# Patient Record
Sex: Female | Born: 1940 | Race: White | Hispanic: No | Smoking: Never smoker
Health system: Southern US, Community
[De-identification: ages and names within clinical notes are randomized; demographics above are authoritative.]

## PROBLEM LIST (undated history)

## (undated) ENCOUNTER — Ambulatory Visit: Disposition: A | Discharge status: Other Acute Inpatient Hospital | Payer: Medicare Other

## (undated) DIAGNOSIS — N200 Calculus of kidney: Secondary | ICD-10-CM

## (undated) DIAGNOSIS — I10 Essential (primary) hypertension: Secondary | ICD-10-CM

## (undated) DIAGNOSIS — E785 Hyperlipidemia, unspecified: Secondary | ICD-10-CM

## (undated) HISTORY — PX: CATARACT EXTRACTION: SUR2

## (undated) HISTORY — PX: COLON SURGERY: SHX602

## (undated) HISTORY — PX: ABDOMINAL HYSTERECTOMY: SHX81

## (undated) HISTORY — PX: OTHER SURGICAL HISTORY: SHX169

---

## 2005-11-12 ENCOUNTER — Ambulatory Visit: Payer: Self-pay | Admitting: Unknown Physician Specialty

## 2005-11-24 ENCOUNTER — Ambulatory Visit: Payer: Self-pay | Admitting: Unknown Physician Specialty

## 2006-02-10 ENCOUNTER — Ambulatory Visit: Payer: Self-pay | Admitting: Gastroenterology

## 2006-11-29 ENCOUNTER — Ambulatory Visit: Payer: Self-pay | Admitting: Unknown Physician Specialty

## 2007-12-04 ENCOUNTER — Ambulatory Visit: Payer: Self-pay | Admitting: Unknown Physician Specialty

## 2008-12-05 ENCOUNTER — Ambulatory Visit: Payer: Self-pay | Admitting: Unknown Physician Specialty

## 2009-12-08 ENCOUNTER — Ambulatory Visit: Payer: Self-pay | Admitting: Unknown Physician Specialty

## 2010-12-15 ENCOUNTER — Ambulatory Visit: Payer: Self-pay | Admitting: Unknown Physician Specialty

## 2011-12-16 ENCOUNTER — Ambulatory Visit: Payer: Self-pay | Admitting: Unknown Physician Specialty

## 2012-09-19 ENCOUNTER — Observation Stay: Payer: Self-pay | Admitting: Internal Medicine

## 2012-09-19 LAB — COMPREHENSIVE METABOLIC PANEL
Albumin: 4.3 g/dL (ref 3.4–5.0)
BUN: 19 mg/dL — ABNORMAL HIGH (ref 7–18)
Bilirubin,Total: 0.2 mg/dL (ref 0.2–1.0)
Creatinine: 0.79 mg/dL (ref 0.60–1.30)
EGFR (Non-African Amer.): >60
Osmolality: 281 (ref 275–301)
SGOT(AST): 18 U/L (ref 15–37)
Sodium: 139 mmol/L (ref 136–145)

## 2012-09-19 LAB — CBC
HCT: 39.3 % (ref 35.0–47.0)
HGB: 13.7 g/dL (ref 12.0–16.0)
MCHC: 34.9 g/dL (ref 32.0–36.0)
WBC: 6.2 10*3/uL (ref 3.6–11.0)

## 2012-09-19 LAB — URINALYSIS, COMPLETE
Bilirubin,UR: NEGATIVE
Ketone: NEGATIVE
Nitrite: NEGATIVE
Ph: 6 (ref 4.5–8.0)
Protein: NEGATIVE
RBC,UR: 33 /HPF (ref 0–5)
Squamous Epithelial: NONE SEEN
WBC UR: 128 /HPF (ref 0–5)

## 2012-09-19 LAB — LIPID PANEL
Cholesterol: 199 mg/dL (ref 0–200)
HDL Cholesterol: 37 mg/dL — ABNORMAL LOW (ref 40–60)
Ldl Cholesterol, Calc: 108 mg/dL — ABNORMAL HIGH (ref 0–100)
VLDL Cholesterol, Calc: 54 mg/dL — ABNORMAL HIGH (ref 5–40)

## 2012-09-20 LAB — BASIC METABOLIC PANEL
BUN: 16 mg/dL (ref 7–18)
Chloride: 106 mmol/L (ref 98–107)
Co2: 29 mmol/L (ref 21–32)
Creatinine: 0.72 mg/dL (ref 0.60–1.30)
EGFR (African American): >60
EGFR (Non-African Amer.): >60
Glucose: 109 mg/dL — ABNORMAL HIGH (ref 65–99)
Osmolality: 281 (ref 275–301)

## 2012-09-20 LAB — MAGNESIUM: Magnesium: 1.5 mg/dL — ABNORMAL LOW

## 2012-09-20 LAB — CBC WITH DIFFERENTIAL/PLATELET
Eosinophil #: 0.2 10*3/uL (ref 0.0–0.7)
HCT: 37.9 % (ref 35.0–47.0)
HGB: 13.2 g/dL (ref 12.0–16.0)
Neutrophil #: 4 10*3/uL (ref 1.4–6.5)
Platelet: 219 10*3/uL (ref 150–440)
RBC: 4.38 10*6/uL (ref 3.80–5.20)
RDW: 14.1 % (ref 11.5–14.5)

## 2012-09-22 LAB — URINE CULTURE

## 2013-02-08 ENCOUNTER — Ambulatory Visit: Payer: Self-pay | Admitting: Unknown Physician Specialty

## 2013-06-11 ENCOUNTER — Ambulatory Visit: Payer: Self-pay | Admitting: Ophthalmology

## 2013-06-11 LAB — POTASSIUM: Potassium: 4.3 mmol/L (ref 3.5–5.1)

## 2013-06-18 ENCOUNTER — Ambulatory Visit: Payer: Self-pay | Admitting: Ophthalmology

## 2013-07-10 ENCOUNTER — Ambulatory Visit: Payer: Self-pay | Admitting: Ophthalmology

## 2014-02-11 ENCOUNTER — Ambulatory Visit: Payer: Self-pay | Admitting: Unknown Physician Specialty

## 2014-08-16 NOTE — Discharge Summary (Signed)
PATIENT NAME:  Anne MaffucciDRIGGERS, Athira L MR#:  [id number] DATE OF BIRTH:  10/14/1940  This is a continuation of a previous job per job ID #[id number] on a different patient.  DATE OF ADMISSION:  09/19/2012 DATE OF DISCHARGE:  09/20/2012  DATE OF ADMISSION: 09/19/2012.   PRIMARY CARE PHYSICIAN: Unknown.   DISCHARGE DIAGNOSES: 1.  Possible transient ischemic attack. 2.  Urinary tract infection. 3.  Hypomagnesemia. 4.  Hypokalemia. 5.  Hypertension  6.  Hyperlipidemia.  CONDITION: Stable.   CODE STATUS: FULL CODE.   HOME MEDICATIONS: Norvasc 10 mg p.o. daily, hydrochlorothiazide 25 mg p.o. daily, aspirin 81 mg p.o. daily, Os-Cal 1250 mg p.o., one tablet once a day, Levaquin 250 mg p.o. once a day for 3 days, atorvastatin 20 mg p.o. at bedtime.  DIET: Low sodium, low fat, low cholesterol diet.   ACTIVITY: As tolerated.   FOLLOWUP CARE: Follow with PCP within 1 to 2 weeks   REASON FOR ADMISSION: Right-sided tingling and numbness.   HOSPITAL COURSE: The patient is a 74 year old Caucasian female with a history of hypertension, presented to the ED with intermittent right-sided tingling and numbness for  1 week. In the ED, patient's CAT scan of the head was negative. She was admitted under observation.  For detailed history and physical examination, please refer to the admission note dictated by  Dr. Zandra Abtsadhika Vasireddy.   After admission the patient was treated with aspirin, Plavix, and  a statin. The patient's lipid panel showed mildly elevated LDL at 103, however the patient's MRI of the brain, carotid duplex, is negative.  The patient's echocardiogram showed an ejection fraction of 65% to 75% with normal ventricular systolic function. The patient had no symptoms  after admission.  Hypokalemia: The patient's potassium was repleted and hypokalemia improved.  Hypomagnesemia: The patient was treated with magnesium.   The patient has no symptoms. Vital signs are stable. She is clinically  stable. She is being discharged to home today. I discussed the patient's discharge plan with the patient; case manager.  Time spent: About 32 minutes.     ____________________________ Shaune PollackQing Madhavi Hamblen, MD qc:dm D: 09/20/2012 19:35:49 ET T: 09/21/2012 09:52:33 ET JOB#: [id number]  cc: Shaune PollackQing Lavon Bothwell, MD, <Dictator> Shaune PollackQING Wandalee Klang MD ELECTRONICALLY SIGNED 09/22/2012 12:12

## 2014-08-16 NOTE — H&P (Signed)
PATIENT NAME:  Anne MaffucciDRIGGERS, De L MR#:  [id number] DATE OF BIRTH:  August 19, 1940  DATE OF ADMISSION:  09/19/2012  ADMITTING PHYSICIAN: Enid Baasadhika Janice Bodine, M.D.   PRIMARY CARE PHYSICIAN: Dr. Filbert BertholdJane Satter from [place].   CHIEF COMPLAINT: Right-sided tingling and numbness.   HISTORY OF PRESENT ILLNESS: The patient is a very pleasant, 74 year old, Caucasian female with past medical history significant for hypertension who presents to the hospital secondary to intermittent complaint of right-sided tingling and numbness going on for one week now.  The patient states that she never had a stroke before, but does have a very strong family history of strokes. She noticed her right-sided tingling and numbness some time last week.  It was intermittent and lasted less than 10 seconds. It was mostly on the right side of the face, especially the right side of her tongue and also the right side of the whole arm and less in the right leg. It has been more persistent over the last couple of days, so presented to the ED.  In the Emergency Room, her CT of the head has been negative. She is being admitted under observation for a possible crescendo of transient ischemic attack symptoms. She takes aspirin 81 mg every day at home.   PAST MEDICAL HISTORY:  Hypertension.   PAST SURGICAL HISTORY:  1. Cataract surgery.  2. Lithotripsy.  3. Colonoscopy about 5 years ago.  4. Total hysterectomy.   ALLERGIES TO MEDICATIONS: No known drug allergies.   HOME MEDICATIONS:  1. Norvasc 10 mg p.o. daily.  2. Aspirin 81 mg p.o. daily.  3. Hydrochlorothiazide 25 mg p.o. daily.  4. Os-Cal 1250 mg p.o. daily.  5. Flaxseed oil as needed for constipation.   SOCIAL HISTORY: Lives at home by herself. No smoking. No alcohol use.   FAMILY HISTORY: Dad with a stroke and also atherosclerotic cardiac disease and also had pacemaker.  Paternal grandparents and also aunts and uncles, everybody had stroke. Mom had a history of stroke and also  breast cancer.   REVIEW OF SYSTEMS: CONSTITUTIONAL: No fever, fatigue or weakness.   EYES: No blurred vision, double vision, inflammation or glaucoma.   ENT: No tinnitus. Positive for hearing loss. No ear pain, epistaxis or discharge.   RESPIRATORY: No cough, wheeze, hemoptysis or chronic obstructive pulmonary disease.   CARDIOVASCULAR: No chest pain, orthopnea, edema, arrhythmia, palpitations or syncope.   GASTROINTESTINAL: No nausea, vomiting, abdominal pain, hematemesis or melena.   GENITOURINARY: No dysuria, hematuria, renal calculus, frequency, or incontinence.   ENDOCRINE: No polyuria, nocturia, thyroid problems, heat or cold intolerance.   HEMATOLOGY: No anemia, easy bruising or bleeding.   SKIN: No acne, rash or lesions.  MUSCULOSKELETAL:  No neck, back, shoulder pain, arthritis or gout.   NEUROLOGIC: No numbness, weakness, CVA, transient ischemic attack or seizures.   PSYCHOLOGICAL: No anxiety, insomnia, depression.   PHYSICAL EXAMINATION: VITAL SIGNS: Temperature 98 degrees Fahrenheit, pulse 98, respirations 18, blood pressure 160/77, pulse oximetry 98% on room air.   GENERAL: Well built, well-nourished female lying in bed, not in any acute distress.   HEENT: Normocephalic, atraumatic. Pupils equal, round, reacting to light. Anicteric sclerae. Extraocular movements intact. Oropharynx clear without erythema, mass or exudates.   OROPHARYNX: Also clear without any lesions or discharge. Ears are clean without any discharge or external lesions.   NECK: Supple. No thyromegaly, JVD or carotid bruits. No lymphadenopathy.   LUNGS: Moving air, bilaterally clear to auscultation. No wheeze or crackles or rhonchi. No use of accessory muscles for  breathing.   CARDIOVASCULAR: S1, S2 regular rate and rhythm. No murmur, gallop or click.  Carotid pulses are equal bilaterally.  normal  pedal pulses. No peripheral edema seen.   ABDOMEN: Soft, nontender, nondistended. No  hepatosplenomegaly. Normal bowel sounds.   MUSCULOSKELETAL: Normal gait and station and leg joints are nontender and no effusion.  Normal range of motion.   SKIN: No acne, rash or lesions.   LYMPHATIC: No cervical or inguinal lymphadenopathy.   NEUROLOGIC: Cranial nerves II through XII were intact. Deep tendon reflexes are 1+ in both knees and also 2+ in biceps.  Motor strength is 5 out of 5 in all 4 extremities. Sensation is intact at this time. She does have some burning sensation in both feet, but her sensation is intact and cerebellar signs are normal at this time.   PSYCHOLOGICAL: The patient is awake, alert, oriented x 3.   LABORATORY DATA: WBC is 6.2, hemoglobin 13.7, hematocrit 39.3, platelet count 234. Sodium 139, potassium 3.1, chloride 103, bicarbonate 31, BUN 19, creatinine 0.79, glucose 118 and calcium 9.4. ALT 27, AST 18, alkaline phosphatase 74, total bilirubin 0.2, and albumin of 4.3. CT of the head showing no acute intracranial abnormalities present. Urinalysis shows 3+ leukocyte esterase, 128 WBCs and 2+ bacteria at this time.  EKG showing normal sinus rhythm, heart rate of 76, no acute ST-T wave abnormalities noted.   ASSESSMENT AND PLAN: A 74 year old female with past medical history of hypertension and some strong family history of stroke, comes with a right-sided tingling and numbness intermittently for one week now.  1. Transient ischemic attack symptoms on the right side with crescendo nature.  On aspirin with strong family history of cerebrovascular accident.  We will admit her under observation, do neuro checks. We will change her aspirin to Plavix and add a statin. Check lipid profile at this time. Also order for MRI of the brain, carotid Doppler and echo. Monitor on oxygen and telemetry. 2. Hyperkalemia is being replaced. Will hold her diuretics.  3. Hypertension. Continue Norvasc.  Hold head CT due to electrolyte deficiencies.  4. Urinary tract infection. Check urine  cultures and started her on Levaquin at this time.  5. Gastrointestinal and deep vein thrombosis prophylaxis, on ranitidine and also Lovenox.  CODE STATUS: Full code.   TIME SPENT ON ADMISSION: 50 minutes.   ____________________________ Enid Baas, MD rk:rw D: 09/19/2012 18:36:00 ET T: 09/19/2012 20:06:38 ET JOB#: [id number]  cc: Filbert Berthold, MD Enid Baas, MD, <Dictator> Enid Baas MD ELECTRONICALLY SIGNED 10/02/2012 15:30

## 2014-08-17 NOTE — Op Note (Signed)
PATIENT NAME:  Anne MaffucciDRIGGERS, Tristine L MR#:  [id number] DATE OF BIRTH:  05/17/1940  DATE OF PROCEDURE:  07/10/2013  PREOPERATIVE DIAGNOSIS: Visually significant cataract of the right eye.   POSTOPERATIVE DIAGNOSIS: Visually significant cataract of the right eye.   OPERATIVE PROCEDURE: Cataract extraction by phacoemulsification with implant of intraocular lens to the right eye.   SURGEON: Galen ManilaWilliam Makhya Arave, MD.   ANESTHESIA:  1. Managed anesthesia care.  2. Topical tetracaine drops followed by 2% Xylocaine jelly applied in the preoperative holding area.   COMPLICATIONS: None.   TECHNIQUE:  Stop and chop.   DESCRIPTION OF PROCEDURE: The patient was examined and consented in the preoperative holding area where the aforementioned topical anesthesia was applied to the right eye and then brought back to the Operating Room where the right eye was prepped and draped in the usual sterile ophthalmic fashion and a lid speculum was placed. A paracentesis was created with the side port blade and the anterior chamber was filled with viscoelastic. A near clear corneal incision was performed with the steel keratome. A continuous curvilinear capsulorrhexis was performed with a cystotome followed by the capsulorrhexis forceps. Hydrodissection and hydrodelineation were carried out with BSS on a blunt cannula. The lens was removed in a stop and chop technique and the remaining cortical material was removed with the irrigation-aspiration handpiece. The capsular bag was inflated with viscoelastic and the Tecnis ZCB00 20.0-diopter lens, serial number [id number] was placed in the capsular bag without complication. The remaining viscoelastic was removed from the eye with the irrigation-aspiration handpiece. The wounds were hydrated. The anterior chamber was flushed with Miostat and the eye was inflated to physiologic pressure. 0.1 mL of cefuroxime concentration 10 mg/mL was placed in the anterior chamber. The wounds were found  to be water tight. The eye was dressed with Vigamox. The patient was given protective glasses to wear throughout the day and a shield with which to sleep tonight. The patient was also given drops with which to begin a drop regimen today and will follow-up with me in one day.   ____________________________ Jerilee FieldWilliam L. Kaelyn Innocent, MD wlp:gb D: 07/10/2013 22:48:36 ET T: 07/11/2013 05:04:30 ET JOB#: [id number]  cc: Pauletta Pickney L. Shyann Hefner, MD, <Dictator> Jerilee FieldWILLIAM L Ramel Tobon MD ELECTRONICALLY SIGNED 07/12/2013 9:11

## 2014-08-17 NOTE — Op Note (Signed)
PATIENT NAME:  Anne Crosby, [name] MR#:  [id number] DATE OF BIRTH:  23-Mar-1941  DATE OF PROCEDURE:  06/18/2013  PREOPERATIVE DIAGNOSIS: Visually significant cataract of the left eye.   POSTOPERATIVE DIAGNOSIS: Visually significant cataract of the left eye.   OPERATIVE PROCEDURE: Cataract extraction by phacoemulsification with implant of intraocular lens to left eye.   SURGEON: Galen ManilaWilliam Geovonni Meyerhoff, MD.   ANESTHESIA:  1. Managed anesthesia care.  2. Topical tetracaine drops followed by 2% Xylocaine jelly applied in the preoperative holding area.   COMPLICATIONS: None.   TECHNIQUE:  Stop and chop.   DESCRIPTION OF PROCEDURE: The patient was examined and consented in the preoperative holding area where the aforementioned topical anesthesia was applied to the left eye and then brought back to the Operating Room where the left eye was prepped and draped in the usual sterile ophthalmic fashion and a lid speculum was placed. A paracentesis was created with the side port blade and the anterior chamber was filled with viscoelastic. A near clear corneal incision was performed with the steel keratome. A continuous curvilinear capsulorrhexis was performed with a cystotome followed by the capsulorrhexis forceps. Hydrodissection and hydrodelineation were carried out with BSS on a blunt cannula. The lens was removed in a stop and chop technique and the remaining cortical material was removed with the irrigation-aspiration handpiece. The capsular bag was inflated with viscoelastic and the Tecnis ZCB 21-diopter lens, serial number [id number] was placed in the capsular bag without complication. The remaining viscoelastic was removed from the eye with the irrigation-aspiration handpiece. The wounds were hydrated. The anterior chamber was flushed with Miostat and the eye was inflated to physiologic pressure. 0.1 mL of cefuroxime concentration 10 mg/mL was placed in the anterior chamber. The wounds were found to be water  tight. The eye was dressed with Vigamox. The patient was given protective glasses to wear throughout the day and a shield with which to sleep tonight. The patient was also given drops with which to begin a drop regimen today and will follow-up with me in one day.   ____________________________ Jerilee FieldWilliam L. Patrizia Paule, MD wlp:sb D: 06/18/2013 11:38:14 ET T: 06/18/2013 12:02:59 ET JOB#: [id number]  cc: Daniesha Driver L. Yoshika Vensel, MD, <Dictator> Jerilee FieldWILLIAM L Ova Meegan MD ELECTRONICALLY SIGNED 06/22/2013 16:10

## 2015-02-10 ENCOUNTER — Other Ambulatory Visit: Payer: Self-pay | Admitting: Unknown Physician Specialty

## 2015-02-10 DIAGNOSIS — M810 Age-related osteoporosis without current pathological fracture: Secondary | ICD-10-CM

## 2015-02-10 DIAGNOSIS — Z1231 Encounter for screening mammogram for malignant neoplasm of breast: Secondary | ICD-10-CM

## 2015-02-26 ENCOUNTER — Other Ambulatory Visit: Payer: Self-pay | Admitting: Unknown Physician Specialty

## 2015-02-26 ENCOUNTER — Ambulatory Visit
Admission: RE | Admit: 2015-02-26 | Discharge: 2015-02-26 | Disposition: A | Discharge status: Home / Self Care | Payer: Medicare Other | Source: Ambulatory Visit | Attending: Unknown Physician Specialty | Admitting: Unknown Physician Specialty

## 2015-02-26 DIAGNOSIS — Z780 Asymptomatic menopausal state: Secondary | ICD-10-CM | POA: Diagnosis present

## 2015-02-26 DIAGNOSIS — Z1231 Encounter for screening mammogram for malignant neoplasm of breast: Secondary | ICD-10-CM | POA: Insufficient documentation

## 2015-02-26 DIAGNOSIS — M810 Age-related osteoporosis without current pathological fracture: Secondary | ICD-10-CM

## 2015-03-29 ENCOUNTER — Encounter: Payer: Self-pay | Admitting: Emergency Medicine

## 2015-03-29 ENCOUNTER — Ambulatory Visit
Admission: EM | Admit: 2015-03-29 | Discharge: 2015-03-29 | Disposition: A | Discharge status: Home / Self Care | Payer: Medicare Other | Attending: Family Medicine | Admitting: Family Medicine

## 2015-03-29 DIAGNOSIS — L270 Generalized skin eruption due to drugs and medicaments taken internally: Secondary | ICD-10-CM

## 2015-03-29 DIAGNOSIS — T50995A Adverse effect of other drugs, medicaments and biological substances, initial encounter: Secondary | ICD-10-CM

## 2015-03-29 HISTORY — DX: Essential (primary) hypertension: I10

## 2015-03-29 HISTORY — DX: Hyperlipidemia, unspecified: E78.5

## 2015-03-29 MED ORDER — LORATADINE 10 MG PO TABS: 10.0000 mg | ORAL_TABLET | Freq: Every day | ORAL | Status: DC

## 2015-03-29 MED ORDER — TRIAMCINOLONE ACETONIDE 0.5 % EX OINT: 1.0000 "application " | TOPICAL_OINTMENT | Freq: Two times a day (BID) | CUTANEOUS | Status: DC

## 2015-03-29 NOTE — ED Provider Notes (Signed)
CSN: [id number]     Arrival date & time 03/29/15  [id number] History   First MD Initiated Contact with Patient 03/29/15 0848     Chief Complaint  Patient presents with  . Allergic Reaction   HPI  Anne Maffucciatricia L Leano is a pleasant [age] y.o. female who presents with rash.  Patient states she was started on Septra for urinary tract infection 7 days ago. She noticed about 4 days into the treatment she began to develop a pruritic pink rash. There is no exudate or discharge. The rash is mostly on bilateral upper arms and anterior chest and abdomen. She only had 1 dose of antibiotics left. She denies a source breath, chest pain, palpitations, angioedema, wheezing, nausea or vomiting. She denies any new sets or detergents. Her last dose was 2 days ago.  Past Medical History  Diagnosis Date  . Hypertension   . Hyperlipemia    Past Surgical History  Procedure Laterality Date  . Abdominal hysterectomy    . Colon surgery    . Cataract extraction     Family History  Problem Relation Age of Onset  . Breast cancer Mother [id number]   Social History  Substance Use Topics  . Smoking status: Never Smoker   . Smokeless tobacco: None  . Alcohol Use: No   OB History    No data available     Review of Systems  Constitutional: Negative.   HENT: Negative.   Eyes: Negative.   Respiratory: Negative.   Cardiovascular: Negative.   Gastrointestinal: Negative.   Endocrine: Negative.   Genitourinary: Negative.   Skin: Positive for rash.  Neurological: Negative.   Hematological: Negative.   Psychiatric/Behavioral: Negative.     Allergies  Sulfur  Home Medications   Prior to Admission medications   Medication Sig Start Date End Date Taking? Authorizing Provider  alendronate (FOSAMAX) 70 MG tablet Take 70 mg by mouth once a week. Take with a full glass of water on an empty stomach.   Yes Historical Provider, MD  amLODipine (NORVASC) 10 MG tablet Take 10 mg by mouth daily.   Yes Historical Provider, MD    aspirin 81 MG tablet Take 81 mg by mouth daily.   Yes Historical Provider, MD  atorvastatin (LIPITOR) 20 MG tablet Take 20 mg by mouth daily.   Yes Historical Provider, MD  losartan (COZAAR) 50 MG tablet Take 50 mg by mouth daily.   Yes Historical Provider, MD  omeprazole (PRILOSEC) 20 MG capsule Take 20 mg by mouth daily.   Yes Historical Provider, MD  loratadine (CLARITIN) 10 MG tablet Take 1 tablet (10 mg total) by mouth daily. 03/29/15   Joselyn ArrowKandice L Mehul Rudin, NP  triamcinolone ointment (KENALOG) 0.5 % Apply 1 application topically 2 (two) times daily. 03/29/15   Joselyn ArrowKandice L Kimiye Strathman, NP   Meds Ordered and Administered this Visit  Medications - No data to display  BP 156/66 mmHg  Pulse 88  Temp(Src) 97.2 F (36.2 C) (Tympanic)  Resp 14  Ht 5\' 4"  (1.626 m)  Wt 145 lb (65.772 kg)  BMI 24.88 kg/m2  SpO2 99% No data found.   Physical Exam  Constitutional: She is oriented to person, place, and time. She appears well-developed and well-nourished. No distress.  HENT:  Head: Normocephalic and atraumatic.  Nose: Nose normal.  Mouth/Throat: Uvula is midline, oropharynx is clear and moist and mucous membranes are normal.  Eyes: Conjunctivae are normal. No scleral icterus.  Neck: Normal range of motion.  Cardiovascular: Normal rate and  regular rhythm.   Pulmonary/Chest: Effort normal and breath sounds normal. No respiratory distress.  Abdominal: Soft. Bowel sounds are normal. She exhibits no distension.  Musculoskeletal: Normal range of motion. She exhibits no edema or tenderness.  Neurological: She is alert and oriented to person, place, and time. No cranial nerve deficit.  Skin: Skin is warm and dry. Rash noted. Rash is maculopapular. She is not diaphoretic. No pallor.     Psychiatric: Her behavior is normal. Judgment normal.  Nursing note and vitals reviewed.   ED Course  Procedures   MDM   1. Allergic drug rash   to Septra.  No evidence of anaphylaxis.  Pt stable now with pruritic  lesions.  Plan: Diagnosis reviewed with patient.  Advised to avoid sulfa drugs in the future.  Advised to go to ER if SOB or angioedema. Rx as per orders;  benefits, risks, potential side effects reviewed  Seek additional medical care if symptoms worsen or are not improving     Joselyn Arrow, NP 03/29/15 [id number]

## 2015-03-29 NOTE — Discharge Instructions (Signed)
Drug Allergy °Allergic reactions to medicines are common. Some allergic reactions are mild. A delayed type of drug allergy that occurs 1 week or more after exposure to a medicine or vaccine is called serum sickness. A life-threatening, sudden (acute) allergic reaction that involves the whole body is called anaphylaxis. °CAUSES  °"True" drug allergies occur when there is an allergic reaction to a medicine. This is caused by overactivity of the immune system. First, the body becomes sensitized. The immune system is triggered by your first exposure to the medicine. Following this first exposure, future exposure to the same medicine may be life-threatening. °Almost any medicine can cause an allergic reaction. Common ones are: °· Penicillin. °· Sulfonamides (sulfa drugs). °· Local anesthetics. °· X-ray dyes that contain iodine. °SYMPTOMS  °Common symptoms of a minor allergic reaction are: °· Swelling around the mouth. °· An itchy red rash or hives. °· Vomiting or diarrhea. °Anaphylaxis can cause swelling of the mouth and throat. This makes it difficult to breathe and swallow. Severe reactions can be fatal within seconds, even after exposure to only a trace amount of the drug that causes the reaction. °HOME CARE INSTRUCTIONS °· If you are unsure of what caused your reaction, write down: °¨ The names of the medicines you took. °¨ How much medicine you took. °¨ How you took the medicine, such as whether you took a pill, injected the medicine, or applied it to your skin. °¨ All of the things you ate and drank. °¨ The date and time of your reaction. °¨ The symptoms of the reaction. °· You may want to follow up with an allergy specialist after the reaction has cleared in order to be tested to confirm the allergy. It is important to confirm that your reaction is an allergy, not just a side effect to the medicine. If you have a true allergy to a medicine, this may prevent that medicine and related medicines from being given to  you when you are very ill. °· If you have hives or a rash: °¨ Take medicines as directed by your caregiver. °¨ You may use an over-the-counter antihistamine (diphenhydramine) as needed. °¨ Apply cold compresses to the skin or take baths in cool water. Avoid hot baths or showers. °· If you are severely allergic: °¨ Continuous observation after a severe reaction may be needed. Hospitalization is often required. °¨ Wear a medical alert bracelet or necklace stating your allergy. °¨ You and your family must learn how to use an anaphylaxis kit or give an epinephrine injection to temporarily treat an emergency allergic reaction. If you have had a severe reaction, always carry your epinephrine injection or anaphylaxis kit with you. This can be lifesaving if you have a severe reaction. °· Do not drive or perform tasks after treatment until the medicines used to treat your reaction have worn off, or until your caregiver says it is okay. °· If you have a drug allergy that was confirmed by your health care provider: °¨ Carry information about the drug allergy with you at all times. °¨ Always check with a pharmacist before taking any over-the-counter medicine. °SEEK MEDICAL CARE IF:  °· You think you had an allergic reaction. Symptoms usually start within 30 minutes after exposure. °· Symptoms are getting worse rather than better. °· You develop new symptoms. °· The symptoms that brought you to your caregiver return. °SEEK IMMEDIATE MEDICAL CARE IF:  °· You have swelling of the mouth, difficulty breathing, or wheezing. °· You have a tight   feeling in your chest or throat.  You develop hives, swelling, or itching all over your body.  You develop severe vomiting or diarrhea.  You feel faint or pass out. This is an emergency. Use your epinephrine injection or anaphylaxis kit as you have been instructed. Call for emergency medical help. Even if you improve after the injection, you need to be examined at a hospital emergency  department. MAKE SURE YOU:   Understand these instructions.  Will watch your condition.  Will get help right away if you are not doing well or get worse.   This information is not intended to replace advice given to you by your health care provider. Make sure you discuss any questions you have with your health care provider.   Document Released: 04/12/2005 Document Revised: 05/03/2014 Document Reviewed: 11/12/2014 Elsevier Interactive Patient Education [employer].

## 2015-03-29 NOTE — ED Notes (Signed)
Rash, allergic reaction to sulfur drug

## 2016-03-22 ENCOUNTER — Other Ambulatory Visit: Payer: Self-pay | Admitting: Unknown Physician Specialty

## 2017-02-24 ENCOUNTER — Other Ambulatory Visit: Payer: Self-pay | Admitting: Unknown Physician Specialty

## 2017-02-24 DIAGNOSIS — M810 Age-related osteoporosis without current pathological fracture: Secondary | ICD-10-CM

## 2017-02-24 DIAGNOSIS — Z780 Asymptomatic menopausal state: Secondary | ICD-10-CM

## 2017-03-24 ENCOUNTER — Ambulatory Visit
Admission: RE | Admit: 2017-03-24 | Discharge: 2017-03-24 | Disposition: A | Discharge status: Home / Self Care | Payer: Medicare Other | Source: Ambulatory Visit | Attending: Unknown Physician Specialty | Admitting: Unknown Physician Specialty

## 2017-03-24 DIAGNOSIS — M818 Other osteoporosis without current pathological fracture: Secondary | ICD-10-CM | POA: Insufficient documentation

## 2017-03-24 DIAGNOSIS — Z780 Asymptomatic menopausal state: Secondary | ICD-10-CM | POA: Diagnosis present

## 2017-03-24 DIAGNOSIS — M810 Age-related osteoporosis without current pathological fracture: Secondary | ICD-10-CM

## 2017-04-16 ENCOUNTER — Other Ambulatory Visit: Payer: Self-pay

## 2017-04-16 ENCOUNTER — Ambulatory Visit
Admission: EM | Admit: 2017-04-16 | Discharge: 2017-04-16 | Disposition: A | Discharge status: Home / Self Care | Payer: Medicare Other | Attending: Family Medicine | Admitting: Family Medicine

## 2017-04-16 DIAGNOSIS — Z8249 Family history of ischemic heart disease and other diseases of the circulatory system: Secondary | ICD-10-CM | POA: Insufficient documentation

## 2017-04-16 DIAGNOSIS — Z79899 Other long term (current) drug therapy: Secondary | ICD-10-CM | POA: Insufficient documentation

## 2017-04-16 DIAGNOSIS — Z882 Allergy status to sulfonamides status: Secondary | ICD-10-CM | POA: Insufficient documentation

## 2017-04-16 DIAGNOSIS — Z820 Family history of epilepsy and other diseases of the nervous system: Secondary | ICD-10-CM | POA: Diagnosis not present

## 2017-04-16 DIAGNOSIS — Z7983 Long term (current) use of bisphosphonates: Secondary | ICD-10-CM | POA: Diagnosis not present

## 2017-04-16 DIAGNOSIS — N3001 Acute cystitis with hematuria: Secondary | ICD-10-CM

## 2017-04-16 DIAGNOSIS — R300 Dysuria: Secondary | ICD-10-CM | POA: Diagnosis present

## 2017-04-16 DIAGNOSIS — Z823 Family history of stroke: Secondary | ICD-10-CM | POA: Diagnosis not present

## 2017-04-16 DIAGNOSIS — E785 Hyperlipidemia, unspecified: Secondary | ICD-10-CM | POA: Diagnosis not present

## 2017-04-16 DIAGNOSIS — I10 Essential (primary) hypertension: Secondary | ICD-10-CM | POA: Insufficient documentation

## 2017-04-16 DIAGNOSIS — Z803 Family history of malignant neoplasm of breast: Secondary | ICD-10-CM | POA: Diagnosis not present

## 2017-04-16 DIAGNOSIS — Z7982 Long term (current) use of aspirin: Secondary | ICD-10-CM | POA: Diagnosis not present

## 2017-04-16 DIAGNOSIS — Z87442 Personal history of urinary calculi: Secondary | ICD-10-CM | POA: Diagnosis not present

## 2017-04-16 DIAGNOSIS — Z90710 Acquired absence of both cervix and uterus: Secondary | ICD-10-CM | POA: Insufficient documentation

## 2017-04-16 HISTORY — DX: Calculus of kidney: N20.0

## 2017-04-16 LAB — URINALYSIS, COMPLETE (UACMP) WITH MICROSCOPIC

## 2017-04-16 MED ORDER — CEPHALEXIN 500 MG PO CAPS: 500.0000 mg | ORAL_CAPSULE | Freq: Two times a day (BID) | ORAL | 0 refills | Status: DC

## 2017-04-16 NOTE — ED Triage Notes (Addendum)
Pt with dysuria and frequency starting yesterday. "I've taken 8 AZO pills since yesterday." [facility]

## 2017-04-16 NOTE — ED Provider Notes (Signed)
[facility]    CSN: [id number] Arrival date & time: 04/16/17  1029  History   Chief Complaint Chief Complaint  Patient presents with  . Dysuria   HPI  76 year old female with a history of recurrent UTI presents with concerns for UTI.  Patient reports that she developed dysuria and urinary frequency/urgency yesterday.  She reports that she is quite uncomfortable.  Her pain is mild currently.  She has been using Azo frequently with improvement.  No flank pain.  No fever.  No abdominal pain.  No known exacerbating factors.  No other associated symptoms.  No other complaints at this time.  Past Medical History:  Diagnosis Date  . Hyperlipemia   . Hypertension   . Kidney stones    Past Surgical History:  Procedure Laterality Date  . ABDOMINAL HYSTERECTOMY    . CATARACT EXTRACTION    . COLON SURGERY    . kidney stone removal     OB History    No data available     Home Medications    Prior to Admission medications   Medication Sig Start Date End Date Taking? Authorizing Provider  alendronate (FOSAMAX) 70 MG tablet Take 70 mg by mouth once a week. Take with a full glass of water on an empty stomach.    [provider]  amLODipine (NORVASC) 10 MG tablet Take 10 mg by mouth daily.    [provider]  aspirin 81 MG tablet Take 81 mg by mouth daily.    [provider]  atorvastatin (LIPITOR) 20 MG tablet Take 20 mg by mouth daily.    [provider]  cephALEXin (KEFLEX) 500 MG capsule Take 1 capsule (500 mg total) by mouth 2 (two) times daily. 04/16/17   Tommie Samsook, Eulah Walkup G, DO  losartan (COZAAR) 50 MG tablet Take 50 mg by mouth daily.    [provider]   Family History Family History  Problem Relation Age of Onset  . Breast cancer Mother 3955  . Cancer Mother   . Alzheimer's disease Mother   . Stroke Mother   . Heart disease Father   . Stroke Father    Social History Social History   Tobacco Use  . Smoking status:  Never Smoker  . Smokeless tobacco: Never Used  Substance Use Topics  . Alcohol use: No  . Drug use: No   Allergies   Sulfur and Sulfa antibiotics   Review of Systems Review of Systems  Constitutional: Negative.   Gastrointestinal: Negative.   Genitourinary: Positive for dysuria, frequency and urgency. Negative for flank pain.   Physical Exam Triage Vital Signs ED Triage Vitals [04/16/17 1037]  Enc Vitals Group     BP (!) 141/72     Pulse Rate (!) 104     Resp 20     Temp 98.4 F (36.9 C)     Temp Source Oral     SpO2 96 %     Weight 149 lb (67.6 kg)     Height 5\' 3"  (1.6 m)     Head Circumference      Peak Flow      Pain Score 4     Pain Loc      Pain Edu?      Excl. in GC?    No data found.  Updated Vital Signs BP (!) 141/72 (BP Location: Left Arm)   Pulse (!) 104   Temp 98.4 F (36.9 C) (Oral)   Resp 20   Ht  5\' 3"  (1.6 m)   Wt 149 lb (67.6 kg)   SpO2 96%   BMI 26.39 kg/m  Physical Exam  Constitutional: She is oriented to person, place, and time. She appears well-developed. No distress.  Cardiovascular: Normal rate, regular rhythm and normal heart sounds.  Pulmonary/Chest: Effort normal and breath sounds normal. She has no wheezes. She has no rales.  Abdominal: Soft. She exhibits no distension. There is no tenderness.  No CVA tenderness.  Neurological: She is alert and oriented to person, place, and time.  Normal speech. Hard of hearing.  Psychiatric: She has a normal mood and affect. Her behavior is normal.  Vitals reviewed.  UC Treatments / Results  Labs (all labs ordered are listed, but only abnormal results are displayed) Labs Reviewed  URINALYSIS, COMPLETE (UACMP) WITH MICROSCOPIC - Abnormal; Notable for the following components:      Result Value   Color, Urine ORANGE (*)    APPearance CLOUDY (*)    Glucose, UA   (*)    Value: TEST NOT REPORTED DUE TO COLOR INTERFERENCE OF URINE PIGMENT   Hgb urine dipstick   (*)    Value: TEST NOT  REPORTED DUE TO COLOR INTERFERENCE OF URINE PIGMENT   Bilirubin Urine   (*)    Value: TEST NOT REPORTED DUE TO COLOR INTERFERENCE OF URINE PIGMENT   Ketones, ur   (*)    Value: TEST NOT REPORTED DUE TO COLOR INTERFERENCE OF URINE PIGMENT   Protein, ur   (*)    Value: TEST NOT REPORTED DUE TO COLOR INTERFERENCE OF URINE PIGMENT   Nitrite   (*)    Value: TEST NOT REPORTED DUE TO COLOR INTERFERENCE OF URINE PIGMENT   Leukocytes, UA   (*)    Value: TEST NOT REPORTED DUE TO COLOR INTERFERENCE OF URINE PIGMENT   Squamous Epithelial / LPF 0-5 (*)    Bacteria, UA FEW (*)    All other components within normal limits  URINE CULTURE    EKG  EKG Interpretation None       Radiology No results found.  Procedures Procedures (including critical care time)  Medications Ordered in UC Medications - No data to display   Initial Impression / Assessment and Plan / UC Course  I have reviewed the triage vital signs and the nursing notes.  Pertinent labs & imaging results that were available during my care of the patient were reviewed by me and considered in my medical decision making (see chart for details).     76 year old female presents with UTI.  She reports a long-standing history of UTI.  This is a new acute problem. Treating with Keflex. Sending culture.   Final Clinical Impressions(s) / UC Diagnoses   Final diagnoses:  Acute cystitis with hematuria    ED Discharge Orders        Ordered    cephALEXin (KEFLEX) 500 MG capsule  2 times daily     04/16/17 1125     Controlled Substance Prescriptions [place] Controlled Substance Registry consulted? Not Applicable   Tommie SamsCook, Barney Russomanno G, DO 04/16/17 1133

## 2017-04-18 ENCOUNTER — Telehealth: Payer: Self-pay | Admitting: *Deleted

## 2017-04-18 LAB — URINE CULTURE

## 2017-04-18 NOTE — Telephone Encounter (Signed)
Patient left message on nurse voicemail at 1532 on 04/17/17 requesting information about medication. Called patient, no answer, left message to call back at earliest convenience.

## 2018-01-15 ENCOUNTER — Ambulatory Visit
Admission: EM | Admit: 2018-01-15 | Discharge: 2018-01-15 | Disposition: A | Discharge status: Home / Self Care | Payer: Medicare Other | Attending: Family Medicine | Admitting: Family Medicine

## 2018-01-15 ENCOUNTER — Encounter: Payer: Self-pay | Admitting: Emergency Medicine

## 2018-01-15 ENCOUNTER — Other Ambulatory Visit: Payer: Self-pay

## 2018-01-15 DIAGNOSIS — S39012A Strain of muscle, fascia and tendon of lower back, initial encounter: Secondary | ICD-10-CM | POA: Diagnosis not present

## 2018-01-15 DIAGNOSIS — T148XXA Other injury of unspecified body region, initial encounter: Secondary | ICD-10-CM

## 2018-01-15 DIAGNOSIS — M545 Low back pain: Secondary | ICD-10-CM | POA: Diagnosis not present

## 2018-01-15 NOTE — ED Triage Notes (Signed)
Patient c/o lower left sided back pain that started on Wed.  Patient reports that she does exercises every day and think that she might have pulled a muscle.

## 2018-01-15 NOTE — Discharge Instructions (Signed)
Tylenol, rest, ice/heat

## 2018-01-15 NOTE — ED Provider Notes (Signed)
[facility]    CSN: [id number] Arrival date & time: 01/15/18  0816     History   Chief Complaint Chief Complaint  Patient presents with  . Back Pain    HPI Simonne Maffucciatricia L Pursell is a 77 y.o. female.   77 yo female with a c/o left side lower chest wall, flank and hip area pain for 5 days after doing some stretching and strengthening exercises. Denies any fevers, chills, fall, injuries, direct trauma, rash. States feels like a pulled muscle.   The history is provided by the patient.  Back Pain    Past Medical History:  Diagnosis Date  . Hyperlipemia   . Hypertension   . Kidney stones     There are no active problems to display for this patient.   Past Surgical History:  Procedure Laterality Date  . ABDOMINAL HYSTERECTOMY    . CATARACT EXTRACTION    . COLON SURGERY    . kidney stone removal      OB History   None      Home Medications    Prior to Admission medications   Medication Sig Start Date End Date Taking? Authorizing Provider  alendronate (FOSAMAX) 70 MG tablet Take 70 mg by mouth once a week. Take with a full glass of water on an empty stomach.   Yes [provider]  amLODipine (NORVASC) 10 MG tablet Take 10 mg by mouth daily.   Yes [provider]  aspirin 81 MG tablet Take 81 mg by mouth daily.   Yes [provider]  atorvastatin (LIPITOR) 20 MG tablet Take 20 mg by mouth daily.   Yes [provider]  cephALEXin (KEFLEX) 500 MG capsule Take 1 capsule (500 mg total) by mouth 2 (two) times daily. 04/16/17  Yes Cook, Jayce G, DO  losartan (COZAAR) 50 MG tablet Take 50 mg by mouth daily.   Yes [provider]    Family History Family History  Problem Relation Age of Onset  . Breast cancer Mother 5255  . Cancer Mother   . Alzheimer's disease Mother   . Stroke Mother   . Heart disease Father   . Stroke Father     Social History Social History   Tobacco Use  . Smoking status: Never Smoker    . Smokeless tobacco: Never Used  Substance Use Topics  . Alcohol use: No  . Drug use: No     Allergies   Sulfur and Sulfa antibiotics   Review of Systems Review of Systems  Musculoskeletal: Positive for back pain.     Physical Exam Triage Vital Signs ED Triage Vitals  Enc Vitals Group     BP 01/15/18 0822 130/68     Pulse Rate 01/15/18 0822 100     Resp 01/15/18 0822 16     Temp 01/15/18 0822 98.4 F (36.9 C)     Temp Source 01/15/18 0822 Oral     SpO2 01/15/18 0822 99 %     Weight 01/15/18 0821 145 lb (65.8 kg)     Height 01/15/18 0821 5\' 3"  (1.6 m)     Head Circumference --      Peak Flow --      Pain Score 01/15/18 0821 7     Pain Loc --      Pain Edu? --      Excl. in GC? --    No data found.  Updated Vital Signs BP 130/68 (BP Location: Left Arm)   Pulse  100   Temp 98.4 F (36.9 C) (Oral)   Resp 16   Ht 5\' 3"  (1.6 m)   Wt 65.8 kg   SpO2 99%   BMI 25.69 kg/m   Visual Acuity Right Eye Distance:   Left Eye Distance:   Bilateral Distance:    Right Eye Near:   Left Eye Near:    Bilateral Near:     Physical Exam  Constitutional: She appears well-developed and well-nourished. No distress.  Cardiovascular: Normal rate and regular rhythm.  Pulmonary/Chest: Effort normal and breath sounds normal. No stridor. No respiratory distress. She has no wheezes. She has no rales. She exhibits tenderness (from lower left sided ribs down to left upper hip area).  Skin: She is not diaphoretic.  Nursing note and vitals reviewed.    UC Treatments / Results  Labs (all labs ordered are listed, but only abnormal results are displayed) Labs Reviewed - No data to display  EKG None  Radiology No results found.  Procedures Procedures (including critical care time)  Medications Ordered in UC Medications - No data to display  Initial Impression / Assessment and Plan / UC Course  I have reviewed the triage vital signs and the nursing notes.  Pertinent labs  & imaging results that were available during my care of the patient were reviewed by me and considered in my medical decision making (see chart for details).      Final Clinical Impressions(s) / UC Diagnoses   Final diagnoses:  Muscle strain     Discharge Instructions     Tylenol, rest, ice/heat    ED Prescriptions    None      1. diagnosis reviewed with patient 2. Recommend supportive treatment as above 3. Follow-up prn if symptoms worsen or don't improve   Controlled Substance Prescriptions [place] Controlled Substance Registry consulted? Not Applicable   Payton Mccallum, MD 01/15/18 1013

## 2018-03-10 ENCOUNTER — Ambulatory Visit
Admission: EM | Admit: 2018-03-10 | Discharge: 2018-03-10 | Disposition: A | Discharge status: Home / Self Care | Payer: Medicare Other | Attending: Family Medicine | Admitting: Family Medicine

## 2018-03-10 DIAGNOSIS — J069 Acute upper respiratory infection, unspecified: Secondary | ICD-10-CM

## 2018-03-10 MED ORDER — IPRATROPIUM BROMIDE 0.06 % NA SOLN: 2.0000 | Freq: Four times a day (QID) | NASAL | 0 refills | Status: DC | PRN

## 2018-03-10 NOTE — ED Provider Notes (Signed)
[facility]    CSN: [id number] Arrival date & time: 03/10/18  0816  History   Chief Complaint Chief Complaint  Patient presents with  . Nasal Congestion   HPI  77 year old female presents with nasal congestion.  2-day history of nasal congestion.  No fever.  No chills.  She has been taking Tylenol Cold and and drinking orange juice without resolution.  Dry cough.  No reports of sinus pain or pressure.  Her primary concern is nasal congestion.  No reports of ear pain.  She is hard of hearing.  No other associated symptoms.  No other complaints.   PMH, Surgical Hx, Family Hx, Social History reviewed and updated as below.  Past Medical History:  Diagnosis Date  . Hyperlipemia   . Hypertension   . Kidney stones    Past Surgical History:  Procedure Laterality Date  . ABDOMINAL HYSTERECTOMY    . CATARACT EXTRACTION    . COLON SURGERY    . kidney stone removal     OB History   None      Home Medications    Prior to Admission medications   Medication Sig Start Date End Date Taking? Authorizing Provider  multivitamin-lutein (OCUVITE-LUTEIN) CAPS capsule Take 1 capsule by mouth daily.   Yes [provider]  alendronate (FOSAMAX) 70 MG tablet Take 70 mg by mouth once a week. Take with a full glass of water on an empty stomach.    [provider]  amLODipine (NORVASC) 10 MG tablet Take 10 mg by mouth daily.    [provider]  aspirin 81 MG tablet Take 81 mg by mouth daily.    [provider]  atorvastatin (LIPITOR) 20 MG tablet Take 20 mg by mouth daily.    [provider]  ipratropium (ATROVENT) 0.06 % nasal spray Place 2 sprays into both nostrils 4 (four) times daily as needed. 03/10/18   Tommie Samsook, Odeth Bry G, DO  losartan (COZAAR) 50 MG tablet Take 50 mg by mouth daily.    [provider]    Family History Family History  Problem Relation Age of Onset  . Breast cancer Mother 7855  . Cancer Mother   .  Alzheimer's disease Mother   . Stroke Mother   . Heart disease Father   . Stroke Father     Social History Social History   Tobacco Use  . Smoking status: Never Smoker  . Smokeless tobacco: Never Used  Substance Use Topics  . Alcohol use: No  . Drug use: No     Allergies   Sulfur and Sulfa antibiotics   Review of Systems Review of Systems  HENT: Positive for congestion.   Respiratory: Positive for cough.    Physical Exam Triage Vital Signs ED Triage Vitals  Enc Vitals Group     BP 03/10/18 0823 (!) 151/75     Pulse Rate 03/10/18 0823 (!) 118     Resp 03/10/18 0823 18     Temp 03/10/18 0823 98.6 F (37 C)     Temp Source 03/10/18 0823 Oral     SpO2 03/10/18 0823 99 %     Weight 03/10/18 0824 143 lb (64.9 kg)     Height 03/10/18 0824 5\' 3"  (1.6 m)     Head Circumference --      Peak Flow --      Pain Score 03/10/18 0824 0     Pain Loc --      Pain Edu? --  Excl. in GC? --    Updated Vital Signs BP (!) 151/75 (BP Location: Right Arm)   Pulse (!) 118   Temp 98.6 F (37 C) (Oral)   Resp 18   Ht 5\' 3"  (1.6 m)   Wt 64.9 kg   SpO2 99%   BMI 25.33 kg/m   Visual Acuity Right Eye Distance:   Left Eye Distance:   Bilateral Distance:    Right Eye Near:   Left Eye Near:    Bilateral Near:     Physical Exam  Constitutional: She appears well-developed. No distress.  HENT:  Head: Normocephalic and atraumatic.  Mouth/Throat: Oropharynx is clear and moist.  Eyes: Conjunctivae are normal. Right eye exhibits no discharge. Left eye exhibits no discharge.  Cardiovascular: Regular rhythm.  Pulmonary/Chest: Effort normal and breath sounds normal. She has no wheezes. She has no rales.  Neurological: She is alert.  Psychiatric: She has a normal mood and affect. Her behavior is normal.  Nursing note and vitals reviewed.  UC Treatments / Results  Labs (all labs ordered are listed, but only abnormal results are displayed) Labs Reviewed - No data to  display  EKG None  Radiology No results found.  Procedures Procedures (including critical care time)  Medications Ordered in UC Medications - No data to display  Initial Impression / Assessment and Plan / UC Course  I have reviewed the triage vital signs and the nursing notes.  Pertinent labs & imaging results that were available during my care of the patient were reviewed by me and considered in my medical decision making (see chart for details).    77 year old female presents with a viral URI.  Treating with Atrovent nasal spray.  Afrin and Nettie pot as well.  Final Clinical Impressions(s) / UC Diagnoses   Final diagnoses:  Upper respiratory tract infection, unspecified type     Discharge Instructions     Use the medication as directed.  You can also use afrin (for just a few days).  Nettipot as well.  Rest, Fluids.  Take care  Dr. Adriana Simas    ED Prescriptions    Medication Sig Dispense Auth. Provider   ipratropium (ATROVENT) 0.06 % nasal spray Place 2 sprays into both nostrils 4 (four) times daily as needed. 15 mL Tommie Sams, DO     Controlled Substance Prescriptions [place] Controlled Substance Registry consulted? Not Applicable   Tommie Sams, DO 03/10/18 9562

## 2018-03-10 NOTE — ED Triage Notes (Signed)
Pt reports nasal congestion ongoing over last 2 days and woke today with "dry" cough Pt denies known fever

## 2018-03-10 NOTE — Discharge Instructions (Signed)
Use the medication as directed.  You can also use afrin (for just a few days).  Nettipot as well.  Rest, Fluids.  Take care  Dr. Adriana Simasook

## 2018-04-01 ENCOUNTER — Other Ambulatory Visit: Payer: Self-pay | Admitting: Family Medicine

## 2019-10-29 ENCOUNTER — Other Ambulatory Visit: Payer: Self-pay

## 2019-10-29 ENCOUNTER — Ambulatory Visit (INDEPENDENT_AMBULATORY_CARE_PROVIDER_SITE_OTHER): Payer: Medicare Other

## 2019-10-29 ENCOUNTER — Encounter: Payer: Self-pay | Admitting: Emergency Medicine

## 2019-10-29 ENCOUNTER — Ambulatory Visit
Admission: EM | Admit: 2019-10-29 | Discharge: 2019-10-29 | Disposition: A | Discharge status: Home / Self Care | Payer: Medicare Other | Attending: Emergency Medicine | Admitting: Emergency Medicine

## 2019-10-29 DIAGNOSIS — N200 Calculus of kidney: Secondary | ICD-10-CM | POA: Insufficient documentation

## 2019-10-29 DIAGNOSIS — R300 Dysuria: Secondary | ICD-10-CM | POA: Insufficient documentation

## 2019-10-29 DIAGNOSIS — M545 Low back pain, unspecified: Secondary | ICD-10-CM

## 2019-10-29 LAB — URINALYSIS, COMPLETE (UACMP) WITH MICROSCOPIC
Bacteria, UA: NONE SEEN
Bilirubin Urine: NEGATIVE
Glucose, UA: NEGATIVE mg/dL
Ketones, ur: NEGATIVE mg/dL
Leukocytes,Ua: NEGATIVE
Nitrite: NEGATIVE
Protein, ur: NEGATIVE mg/dL
Specific Gravity, Urine: 1.01 (ref 1.005–1.030)
WBC, UA: NONE SEEN WBC/hpf (ref 0–5)
pH: 7 (ref 5.0–8.0)

## 2019-10-29 NOTE — ED Triage Notes (Signed)
Pt c/o urinary frequency, left side/back pain. Started about noon yesterday. She states the pain is sharp and feels like she is passing a kidney stone. She has h/o kidney stones. She has been taking APAP and helps. Pt is set up for a CT from her urologist but states they were no open today.

## 2019-10-29 NOTE — ED Provider Notes (Signed)
[facility] ____________________________________________  Time seen: Approximately 4:28 PM  I have reviewed the triage vital signs and the nursing notes.   HISTORY  Chief Complaint Back Pain   HPI Anne Crosby is a 79 y.o. female history of nephrolithiasis and recurrent urinary tract infections, presenting for evaluation of low back pain.  Patient reports low back pain is sharp and intermittent catching.  Did take Tylenol earlier which helped with the pain.  States pain is milder now.  Occasionally notices with positional changes.  Denies other triggers.  Denies pain wrapping around abdomen.  Denies abdominal pain, vomiting, diarrhea, fevers.  Does still have some urinary frequency.  Patient states that she has had 5 doses of Ceftin antibiotic, starting it on Saturday from previous urgent care visit last week for UTI.  Patient states that she has a follow-up with a CT scan with her urologist at the end of this month.  Patient expressed concern of kidney stone.  Reports otherwise doing well.  Denies fall or injury.  Of note, patient chart review patient was seen at [facility] urgent care on 630 for dysuria complaints and was noted to have a urinary tract infection.  Was treated with oral Cipro, but did not fill prescription due to concern of side effects.  Patient then called back on 7/1 received prescription for Ceftin and started it on Saturday.  Urinalysis from urgent care visit showed positive urine culture for Proteus Mirabelis cephalosporin sensitive, but resistant to doxycycline and Macrobid.  [place], [place] [facility] : PCP  Past Medical History:  Diagnosis Date  . Hyperlipemia   . Hypertension   . Kidney stones     There are no problems to display for this patient.   Past Surgical History:  Procedure Laterality Date  . ABDOMINAL HYSTERECTOMY    . CATARACT EXTRACTION    . COLON SURGERY    . kidney stone removal       No current facility-administered  medications for this encounter.  Current Outpatient Medications:  .  alendronate (FOSAMAX) 70 MG tablet, Take 70 mg by mouth once a week. Take with a full glass of water on an empty stomach., Disp: , Rfl:  .  amLODipine (NORVASC) 10 MG tablet, Take 10 mg by mouth daily., Disp: , Rfl:  .  aspirin 81 MG tablet, Take 81 mg by mouth daily., Disp: , Rfl:  .  atorvastatin (LIPITOR) 20 MG tablet, Take 20 mg by mouth daily., Disp: , Rfl:  .  bifidobacterium infantis (ALIGN) capsule, Take by mouth., Disp: , Rfl:  .  ipratropium (ATROVENT) 0.06 % nasal spray, Place 2 sprays into both nostrils 4 (four) times daily as needed., Disp: 15 mL, Rfl: 0 .  losartan (COZAAR) 50 MG tablet, Take 50 mg by mouth daily., Disp: , Rfl:  .  multivitamin-lutein (OCUVITE-LUTEIN) CAPS capsule, Take 1 capsule by mouth daily., Disp: , Rfl:  .  cefUROXime (CEFTIN) 500 MG tablet, SMARTSIG:1 Tablet(s) By Mouth Every 12 Hours, Disp: , Rfl:  .  Potassium Citrate 15 MEQ (1620 MG) TBCR, Take by mouth., Disp: , Rfl:   Allergies Diclofenac, Sulfur, and Sulfa antibiotics  Family History  Problem Relation Age of Onset  . Breast cancer Mother 52  . Cancer Mother   . Alzheimer's disease Mother   . Stroke Mother   . Heart disease Father   . Stroke Father     Social History Social History   Tobacco Use  . Smoking status: Never Smoker  .  Smokeless tobacco: Never Used  Vaping Use  . Vaping Use: Never assessed  Substance Use Topics  . Alcohol use: No  . Drug use: No    Review of Systems Constitutional: No fever Cardiovascular: Denies chest pain. Respiratory: Denies shortness of breath. Gastrointestinal: No abdominal pain.  No nausea, no vomiting.  No diarrhea.  No constipation. Genitourinary: Positive for dysuria. Musculoskeletal: Positive for back pain. Skin: Negative for rash.   ____________________________________________   PHYSICAL EXAM:  VITAL SIGNS: ED Triage Vitals  Enc Vitals Group     BP 10/29/19  1604 136/69     Pulse Rate 10/29/19 1604 84     Resp 10/29/19 1604 18     Temp 10/29/19 1604 98.1 F (36.7 C)     Temp Source 10/29/19 1604 Oral     SpO2 10/29/19 1604 99 %     Weight 10/29/19 1556 143 lb 1.3 oz (64.9 kg)     Height 10/29/19 1556 5\' 3"  (1.6 m)     Head Circumference --      Peak Flow --      Pain Score 10/29/19 1556 8     Pain Loc --      Pain Edu? --      Excl. in [place]? --     Constitutional: Alert and oriented. Well appearing and in no acute distress. Eyes: Conjunctivae are normal.  ENT      Head: Normocephalic and atraumatic. Cardiovascular: Normal rate, regular rhythm. Grossly normal heart sounds.  Good peripheral circulation. Respiratory: Normal respiratory effort without tachypnea nor retractions. Breath sounds are clear and equal bilaterally. No wheezes, rales, rhonchi. Gastrointestinal: Soft and nontender.  No CVA tenderness. Musculoskeletal: Steady gait.  No midline cervical or thoracic tenderness palpation.  Mild midline lower lumbar and left paralumbar tenderness to direct palpation, no saddle anesthesia, changes positions quickly in room.  Steady gait. Neurologic:  Normal speech and language. No gross focal neurologic deficits are appreciated. Speech is normal. No gait instability.  Skin:  Skin is warm, dry and intact. No rash noted. Psychiatric: Mood and affect are normal. Speech and behavior are normal. Patient exhibits appropriate insight and judgment   ___________________________________________   LABS (all labs ordered are listed, but only abnormal results are displayed)  Labs Reviewed  URINALYSIS, COMPLETE (UACMP) WITH MICROSCOPIC - Abnormal; Notable for the following components:      Result Value   Hgb urine dipstick TRACE (*)    All other components within normal limits  URINE CULTURE    By care everywhere Component 5 d ago  Culture Urine  >=100,000 colonies/mL Proteus mirabilisAbnormal     Culture Urine  1,000-10,000 colonies/mL  Mixed floraAbnormal     Resulting Agency [facility]  Susceptibility  Organism Antibiotic Method Susceptibility  Proteus mirabilis Amikacin MIC Susceptible  Proteus mirabilis Amoxicillin + Clavulanic Acid MIC Susceptible  Proteus mirabilis Ampicillin MIC Susceptible  Proteus mirabilis Ampicillin + Sulbactam MIC Susceptible  Proteus mirabilis Cefazolin MIC Susceptible   Comment: Cefazolin results predict results for oral agents cephalexin,cefaclor,cefdinir,etc.  Proteus mirabilis Cefuroxime MIC Susceptible  Proteus mirabilis Ciprofloxacin MIC Susceptible  Proteus mirabilis Gentamicin MIC Susceptible  Proteus mirabilis Nitrofurantoin MIC Resistant  Proteus mirabilis Piperacillin/Tazobactam MIC Susceptible  Proteus mirabilis Tetracycline MIC Resistant  Proteus mirabilis Tobramycin MIC Susceptible  Proteus mirabilis Trimethoprim + Sulfamethoxazole MIC Susceptible    ____________________________________________  RADIOLOGY  DG Abdomen 1 View  Result Date: 10/29/2019 CLINICAL DATA:  79 year old female with low back pain. History of kidney stones. EXAM:  ABDOMEN - 1 VIEW COMPARISON:  None. FINDINGS: There is no bowel dilatation or evidence of obstruction. Moderate stool noted throughout the colon. No free air identified. Several bilateral upper abdominal radiopaque foci noted which may represent vascular calcification or granulomas. An 18 mm radiopaque focus noted over the inferior pole of the right renal silhouette, likely a kidney stone. Multiple phleboliths noted over the pelvis. 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 No acute osseous pathology. IMPRESSION: 1. Probable 18 mm right renal stone. 2. Moderate colonic stool burden.  No bowel obstruction. Electronically Signed   By: Elgie Collard M.D.   On: 10/29/2019 17:16   ____________________________________________   PROCEDURES Procedures    INITIAL IMPRESSION / ASSESSMENT AND  PLAN / ED COURSE  Pertinent labs & imaging results that were available during my care of the patient were reviewed by me and considered in my medical decision making (see chart for details).  Overall well-appearing patient.  No acute distress.  Steady gait.  Lower lumbar pain.  Patient currently on Ceftin for UTI which urine culture via care everywhere was noted to be sensitive to cephalosporins.  Continue current antibiotic.  Urinalysis today improved, trace hemoglobin, KUB noted.  KUB as above per radiologist, reviewed, probable right 18 mm renal stone in lower renal pole, stool present.  No left nephrolithiasis noted.  Patient has no right back or right flank pain.  Counseled patient regarding right concern of stone and follow-up with her urologist this week.  Suspect musculoskeletal back pain.  Continue over-the-counter Tylenol, ice, stretching, supportive care.  Follow-up outpatient with urology.  Discussed her follow-up and return parameters.  Discussed follow up with Primary care physician this week. Discussed follow up and return parameters including no resolution or any worsening concerns. Patient verbalized understanding and agreed to plan.   ____________________________________________   FINAL CLINICAL IMPRESSION(S) / ED DIAGNOSES  Final diagnoses:  Acute left-sided low back pain without sciatica  Right kidney stone  Dysuria     ED Discharge Orders    None       Note: This dictation was prepared with Dragon dictation along with smaller phrase technology. Any transcriptional errors that result from this process are unintentional.         Renford Dills, NP 10/29/19 [phone]

## 2019-10-29 NOTE — Discharge Instructions (Addendum)
Continue taking your antibiotic at home as prescribed.  Drink plenty of fluids.  Take Tylenol as needed.  Follow-up very closely with your urologist due to the right suspected kidney stone noted on x-ray.  Call tomorrow to schedule follow-up for this week.  Follow up with your primary care physician this week.  Proceed erectly to emergency room for worsening complaints.

## 2019-10-31 LAB — URINE CULTURE: Culture: <10000 — AB

## 2020-03-11 ENCOUNTER — Other Ambulatory Visit: Payer: Self-pay | Admitting: Unknown Physician Specialty

## 2020-03-11 DIAGNOSIS — M810 Age-related osteoporosis without current pathological fracture: Secondary | ICD-10-CM

## 2020-04-29 ENCOUNTER — Other Ambulatory Visit: Payer: Self-pay

## 2020-04-29 ENCOUNTER — Ambulatory Visit
Admission: RE | Admit: 2020-04-29 | Discharge: 2020-04-29 | Disposition: A | Discharge status: Home / Self Care | Payer: Medicare Other | Source: Ambulatory Visit | Attending: Unknown Physician Specialty | Admitting: Unknown Physician Specialty

## 2020-04-29 DIAGNOSIS — M810 Age-related osteoporosis without current pathological fracture: Secondary | ICD-10-CM

## 2021-03-30 ENCOUNTER — Encounter: Payer: Self-pay | Admitting: Emergency Medicine

## 2021-03-30 ENCOUNTER — Other Ambulatory Visit: Payer: Self-pay

## 2021-03-30 ENCOUNTER — Ambulatory Visit
Admission: EM | Admit: 2021-03-30 | Discharge: 2021-03-30 | Disposition: A | Discharge status: Home / Self Care | Payer: Medicare Other

## 2021-03-30 DIAGNOSIS — S46212A Strain of muscle, fascia and tendon of other parts of biceps, left arm, initial encounter: Secondary | ICD-10-CM | POA: Diagnosis not present

## 2021-03-30 DIAGNOSIS — M25512 Pain in left shoulder: Secondary | ICD-10-CM | POA: Diagnosis not present

## 2021-03-30 DIAGNOSIS — M79622 Pain in left upper arm: Secondary | ICD-10-CM

## 2021-03-30 NOTE — Discharge Instructions (Signed)
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  Reach back out to your PCP to ask about the physical therapy referral.  If you have not made any improvement in a month you should see physical therapy. - Tylenol for pain, can also use Voltaren gel, muscle rubs.  Can try heat or ice.  Continue to use your compression wrap. - May also speak with PCP about imaging.  We discussed imaging today but you have decided to hold off. - Follow-up with Ortho as needed on acute worsening of symptoms.  See contact info below.  You have a condition requiring you to follow up with Orthopedics so please call one of the following office for appointment:   [facility] [address] [place], [place] [address] Phone: [phone]  [facility] [address], [place], [place] [address] Phone: [phone]

## 2021-03-30 NOTE — ED Provider Notes (Signed)
[facility]    CSN: [id number] Arrival date & time: 03/30/21  1441      History   Chief Complaint Chief Complaint  Patient presents with   Arm Injury    left    HPI Anne Crosby is a 80 y.o. female presenting for approximately 1 month history of left upper arm pain.  Patient reports she experienced the pain after she was reaching to get something and twisted her arm.  She denies any trauma to the arm.  Patient says she followed up with her PCP after it happened.  Reports she was told it was probably a sprain or strain.  Patient says she is experienced no improvement over the past 1 month.  She has been using a heating pad, compression wrap and Tylenol for pain.  Patient reports she only really experiences the pain when she moves her arm a certain way like if she reaches over and touches her opposite shoulder she will experience pain in the biceps.  Also reports experiencing pain when she abducts the shoulder.  She does have full range of motion of her shoulder but admits to pain when she lowers her arm.  No numbness or tingling.  No other complaints.  HPI  Past Medical History:  Diagnosis Date   Hyperlipemia    Hypertension    Kidney stones     There are no problems to display for this patient.   Past Surgical History:  Procedure Laterality Date   ABDOMINAL HYSTERECTOMY     CATARACT EXTRACTION     COLON SURGERY     kidney stone removal      OB History   No obstetric history on file.      Home Medications    Prior to Admission medications   Medication Sig Start Date End Date Taking? Authorizing Provider  amLODipine (NORVASC) 10 MG tablet Take 10 mg by mouth daily.   Yes [provider]  aspirin 81 MG tablet Take 81 mg by mouth daily.   Yes [provider]  atorvastatin (LIPITOR) 20 MG tablet Take 20 mg by mouth daily.   Yes [provider]  bifidobacterium infantis (ALIGN) capsule Take by mouth. 05/09/19  Yes [provider]  Cholecalciferol (VITAMIN D) 50 MCG (2000 UT) CAPS  05/19/09  Yes [provider]  losartan (COZAAR) 50 MG tablet Take 50 mg by mouth daily.   Yes [provider]  metFORMIN (GLUCOPHAGE-XR) 500 MG 24 hr tablet TAKE 1 TABLET BY MOUTH EVERY DAY WITH DINNER 03/11/21  Yes [provider]  Multiple Vitamin (MULTIVITAMIN ADULT PO) Take 1 tablet by mouth daily. 08/10/06  Yes [provider]  Potassium Citrate 15 MEQ (1620 MG) TBCR Take by mouth. 10/24/19  Yes [provider]  alendronate (FOSAMAX) 70 MG tablet Take 70 mg by mouth once a week. Take with a full glass of water on an empty stomach.    [provider]  cefUROXime (CEFTIN) 500 MG tablet SMARTSIG:1 Tablet(s) By Mouth Every 12 Hours 10/25/19   [provider]  ipratropium (ATROVENT) 0.06 % nasal spray Place 2 sprays into both nostrils 4 (four) times daily as needed. 03/10/18   Tommie Sams, DO  multivitamin-lutein (OCUVITE-LUTEIN) CAPS capsule Take 1 capsule by mouth daily.    [provider]  pantoprazole (PROTONIX) 40 MG tablet Take 40 mg by mouth daily. 01/09/21   [provider]    Family History Family History  Problem Relation Age of  Onset   Breast cancer Mother 35   Cancer Mother    Alzheimer's disease Mother    Stroke Mother    Heart disease Father    Stroke Father     Social History Social History   Tobacco Use   Smoking status: Never   Smokeless tobacco: Never  Substance Use Topics   Alcohol use: No   Drug use: No     Allergies   Diclofenac, Elemental sulfur, and Sulfa antibiotics   Review of Systems Review of Systems  Respiratory:  Negative for shortness of breath.   Cardiovascular:  Negative for chest pain.  Musculoskeletal:  Positive for arthralgias. Negative for back pain, joint swelling and neck pain.  Skin:  Negative for color change, rash and wound.  Neurological:  Negative for weakness and numbness.     Physical Exam Triage Vital Signs ED Triage Vitals  Enc Vitals Group     BP 03/30/21 1608 (!) 149/76     Pulse Rate 03/30/21 1608 87     Resp 03/30/21 1608 18     Temp 03/30/21 1608 97.9 F (36.6 C)     Temp Source 03/30/21 1608 Oral     SpO2 03/30/21 1608 98 %     Weight 03/30/21 1606 143 lb 1.3 oz (64.9 kg)     Height 03/30/21 1606 5\' 3"  (1.6 m)     Head Circumference --      Peak Flow --      Pain Score 03/30/21 1605 0     Pain Loc --      Pain Edu? --      Excl. in [place]? --    No data found.  Updated Vital Signs BP (!) 149/76 (BP Location: Right Arm)   Pulse 87   Temp 97.9 F (36.6 C) (Oral)   Resp 18   Ht 5\' 3"  (1.6 m)   Wt 143 lb 1.3 oz (64.9 kg)   SpO2 98%   BMI 25.35 kg/m      Physical Exam Vitals and nursing note reviewed.  Constitutional:      General: She is not in acute distress.    Appearance: Normal appearance. She is not ill-appearing or toxic-appearing.  HENT:     Head: Normocephalic and atraumatic.  Eyes:     General: No scleral icterus.       Right eye: No discharge.        Left eye: No discharge.     Conjunctiva/sclera: Conjunctivae normal.  Cardiovascular:     Rate and Rhythm: Normal rate and regular rhythm.     Heart sounds: Normal heart sounds.  Pulmonary:     Effort: Pulmonary effort is normal. No respiratory distress.     Breath sounds: Normal breath sounds.  Musculoskeletal:     Left shoulder: Tenderness (at [facility] joint) present. No swelling. Normal range of motion. Normal strength. Normal pulse.     Left upper arm: Tenderness (biceps and triceps muscles as well as biceps tendon (which is intact) at antecubital tunnel) present. No swelling.     Left elbow: No swelling or deformity. Normal range of motion. No tenderness.     Cervical back: Neck supple.  Skin:    General: Skin is dry.  Neurological:     General: No focal deficit present.     Mental Status: She is alert. Mental status is at baseline.     Motor: No weakness.      Gait: Gait normal.  Psychiatric:  Mood and Affect: Mood normal.        Behavior: Behavior normal.        Thought Content: Thought content normal.     UC Treatments / Results  Labs (all labs ordered are listed, but only abnormal results are displayed) Labs Reviewed - No data to display  EKG   Radiology No results found.  Procedures Procedures (including critical care time)  Medications Ordered in UC Medications - No data to display  Initial Impression / Assessment and Plan / UC Course  I have reviewed the triage vital signs and the nursing notes.  Pertinent labs & imaging results that were available during my care of the patient were reviewed by me and considered in my medical decision making (see chart for details).  80 year old female presenting for left forearm pain for the past 4 to 5 weeks.  Reports pain onset after she was reaching for something and twisted her arm.  No improvement or worsening of symptoms in the past 1 week.  She does have tenderness palpation of the biceps and triceps muscles diffusely.  Full range of motion of entire left arm but increased pain with reaching to touch opposite shoulder and abducting/abducting shoulder.  Good strength.  Discussed with patient getting x-rays.  Discussed getting x-ray of her shoulder to see if she has any AC joint arthropathy or separation.  Patient says she would like to try physical therapy first before going ahead with imaging.  Advised her to reach back out to her PCP who did suggest physical therapy if not improving.  She says she will do so.  Advise continue with Tylenol, heating pad and compression arm.  Discussed keeping the arm moving so that she does not have stiffness of the arm or shoulder.  Patient advised to follow-up with Ortho if symptoms worsen before she can follow back up with her PCP.   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  Reach back out to your PCP to ask about the physical therapy referral.  If you have not made any improvement in a month you should see physical therapy. - Tylenol for pain, can also use Voltaren gel, muscle rubs.  Can try heat or ice.  Continue to use your compression wrap. - May also speak with PCP about imaging.  We discussed imaging today but you have decided to hold off. - Follow-up with Ortho as needed on acute worsening of symptoms.  See contact info below.  You have a condition requiring you to follow up with Orthopedics so please call one of the following office for appointment:   [facility] [address] [place], [place] [address] Phone: [phone]  [facility] [address] [place],  [address] Phone: [phone]      ED Prescriptions   None    PDMP not reviewed this encounter.   Danton Clap, PA-C 03/30/21 1710

## 2021-03-30 NOTE — ED Triage Notes (Signed)
Pt c/o left upper arm pain. She states she turned her arm over and felt something pull in her arm. Started about 4-5 weeks ago. She is wearing a brace and using heat and ice. She states it is certain positions that cause pain.

## 2021-08-12 ENCOUNTER — Observation Stay
Admission: EM | Admit: 2021-08-12 | Discharge: 2021-08-13 | Disposition: A | Discharge status: Home / Self Care | Payer: Medicare Other | Attending: Internal Medicine | Admitting: Internal Medicine

## 2021-08-12 ENCOUNTER — Emergency Department: Payer: Medicare Other

## 2021-08-12 ENCOUNTER — Encounter: Payer: Self-pay | Admitting: Emergency Medicine

## 2021-08-12 ENCOUNTER — Other Ambulatory Visit: Payer: Self-pay

## 2021-08-12 DIAGNOSIS — I10 Essential (primary) hypertension: Secondary | ICD-10-CM | POA: Insufficient documentation

## 2021-08-12 DIAGNOSIS — N1330 Unspecified hydronephrosis: Secondary | ICD-10-CM | POA: Diagnosis not present

## 2021-08-12 DIAGNOSIS — Z7984 Long term (current) use of oral hypoglycemic drugs: Secondary | ICD-10-CM | POA: Diagnosis not present

## 2021-08-12 DIAGNOSIS — Z79899 Other long term (current) drug therapy: Secondary | ICD-10-CM | POA: Diagnosis not present

## 2021-08-12 DIAGNOSIS — R339 Retention of urine, unspecified: Secondary | ICD-10-CM

## 2021-08-12 DIAGNOSIS — R338 Other retention of urine: Secondary | ICD-10-CM | POA: Diagnosis not present

## 2021-08-12 DIAGNOSIS — Z87442 Personal history of urinary calculi: Secondary | ICD-10-CM | POA: Diagnosis not present

## 2021-08-12 DIAGNOSIS — K5900 Constipation, unspecified: Secondary | ICD-10-CM | POA: Diagnosis present

## 2021-08-12 DIAGNOSIS — E871 Hypo-osmolality and hyponatremia: Secondary | ICD-10-CM | POA: Insufficient documentation

## 2021-08-12 DIAGNOSIS — N1339 Other hydronephrosis: Secondary | ICD-10-CM

## 2021-08-12 DIAGNOSIS — I700 Atherosclerosis of aorta: Secondary | ICD-10-CM | POA: Diagnosis not present

## 2021-08-12 DIAGNOSIS — K5790 Diverticulosis of intestine, part unspecified, without perforation or abscess without bleeding: Secondary | ICD-10-CM

## 2021-08-12 DIAGNOSIS — E876 Hypokalemia: Secondary | ICD-10-CM | POA: Diagnosis not present

## 2021-08-12 DIAGNOSIS — K219 Gastro-esophageal reflux disease without esophagitis: Secondary | ICD-10-CM | POA: Diagnosis present

## 2021-08-12 DIAGNOSIS — R072 Precordial pain: Secondary | ICD-10-CM | POA: Insufficient documentation

## 2021-08-12 DIAGNOSIS — Z7982 Long term (current) use of aspirin: Secondary | ICD-10-CM | POA: Insufficient documentation

## 2021-08-12 DIAGNOSIS — K5730 Diverticulosis of large intestine without perforation or abscess without bleeding: Secondary | ICD-10-CM | POA: Diagnosis not present

## 2021-08-12 DIAGNOSIS — E119 Type 2 diabetes mellitus without complications: Secondary | ICD-10-CM | POA: Diagnosis not present

## 2021-08-12 DIAGNOSIS — E785 Hyperlipidemia, unspecified: Secondary | ICD-10-CM | POA: Diagnosis not present

## 2021-08-12 DIAGNOSIS — K449 Diaphragmatic hernia without obstruction or gangrene: Secondary | ICD-10-CM

## 2021-08-12 DIAGNOSIS — R350 Frequency of micturition: Secondary | ICD-10-CM | POA: Diagnosis present

## 2021-08-12 LAB — BASIC METABOLIC PANEL
Anion gap: 9 (ref 5–15)
Anion gap: 8 (ref 5–15)
BUN: 10 mg/dL (ref 8–23)
BUN: 9 mg/dL (ref 8–23)
CO2: 24 mmol/L (ref 22–32)
CO2: 26 mmol/L (ref 22–32)
Calcium: 8.2 mg/dL — ABNORMAL LOW (ref 8.9–10.3)
Calcium: 8.8 mg/dL — ABNORMAL LOW (ref 8.9–10.3)
Chloride: 90 mmol/L — ABNORMAL LOW (ref 98–111)
Chloride: 94 mmol/L — ABNORMAL LOW (ref 98–111)
Creatinine, Ser: 0.57 mg/dL (ref 0.44–1.00)
Creatinine, Ser: 0.69 mg/dL (ref 0.44–1.00)
GFR, Estimated: >60 mL/min (ref >60)
GFR, Estimated: >60 mL/min (ref >60)
Glucose, Bld: 138 mg/dL — ABNORMAL HIGH (ref 70–99)
Glucose, Bld: 119 mg/dL — ABNORMAL HIGH (ref 70–99)
Potassium: 3.8 mmol/L (ref 3.5–5.1)
Potassium: 4.1 mmol/L (ref 3.5–5.1)
Sodium: 123 mmol/L — ABNORMAL LOW (ref 135–145)
Sodium: 128 mmol/L — ABNORMAL LOW (ref 135–145)

## 2021-08-12 LAB — LIPASE, BLOOD: Lipase: 24 U/L (ref 11–51)

## 2021-08-12 LAB — OSMOLALITY: Osmolality: 261 mOsm/kg — ABNORMAL LOW (ref 275–295)

## 2021-08-12 LAB — CBC WITH DIFFERENTIAL/PLATELET
Abs Immature Granulocytes: 0.06 10*3/uL (ref 0.00–0.07)
Basophils Absolute: 0 10*3/uL (ref 0.0–0.1)
Basophils Relative: 0 %
Eosinophils Absolute: 0.1 10*3/uL (ref 0.0–0.5)
Eosinophils Relative: 1 %
HCT: 41.5 % (ref 36.0–46.0)
Hemoglobin: 13.6 g/dL (ref 12.0–15.0)
Immature Granulocytes: 1 %
Lymphocytes Relative: 9 %
Lymphs Abs: 0.9 10*3/uL (ref 0.7–4.0)
MCH: 29.2 pg (ref 26.0–34.0)
MCHC: 32.8 g/dL (ref 30.0–36.0)
MCV: 89.1 fL (ref 80.0–100.0)
Monocytes Absolute: 0.5 10*3/uL (ref 0.1–1.0)
Monocytes Relative: 5 %
Neutro Abs: 8.4 10*3/uL — ABNORMAL HIGH (ref 1.7–7.7)
Neutrophils Relative %: 84 %
Platelets: 302 10*3/uL (ref 150–400)
RBC: 4.66 MIL/uL (ref 3.87–5.11)
RDW: 13.3 % (ref 11.5–15.5)
WBC: 10 10*3/uL (ref 4.0–10.5)
nRBC: 0 % (ref 0.0–0.2)

## 2021-08-12 LAB — URINALYSIS, ROUTINE W REFLEX MICROSCOPIC
Bilirubin Urine: NEGATIVE
Glucose, UA: NEGATIVE mg/dL
Ketones, ur: NEGATIVE mg/dL
Leukocytes,Ua: NEGATIVE
Nitrite: POSITIVE — AB
Protein, ur: NEGATIVE mg/dL
Specific Gravity, Urine: 1.005 (ref 1.005–1.030)
pH: 7 (ref 5.0–8.0)

## 2021-08-12 LAB — MAGNESIUM: Magnesium: 1.7 mg/dL (ref 1.7–2.4)

## 2021-08-12 LAB — GLUCOSE, CAPILLARY
Glucose-Capillary: 122 mg/dL — ABNORMAL HIGH (ref 70–99)
Glucose-Capillary: 142 mg/dL — ABNORMAL HIGH (ref 70–99)

## 2021-08-12 LAB — COMPREHENSIVE METABOLIC PANEL
ALT: 22 U/L (ref 0–44)
AST: 29 U/L (ref 15–41)
Albumin: 4.6 g/dL (ref 3.5–5.0)
Alkaline Phosphatase: 52 U/L (ref 38–126)
Anion gap: 10 (ref 5–15)
BUN: 13 mg/dL (ref 8–23)
CO2: 25 mmol/L (ref 22–32)
Calcium: 9.1 mg/dL (ref 8.9–10.3)
Chloride: 87 mmol/L — ABNORMAL LOW (ref 98–111)
Creatinine, Ser: 0.75 mg/dL (ref 0.44–1.00)
GFR, Estimated: >60 mL/min (ref >60)
Glucose, Bld: 153 mg/dL — ABNORMAL HIGH (ref 70–99)
Potassium: 3.4 mmol/L — ABNORMAL LOW (ref 3.5–5.1)
Sodium: 122 mmol/L — ABNORMAL LOW (ref 135–145)
Total Bilirubin: 1 mg/dL (ref 0.3–1.2)
Total Protein: 7.7 g/dL (ref 6.5–8.1)

## 2021-08-12 LAB — PROTIME-INR
INR: 1 (ref 0.8–1.2)
Prothrombin Time: 13 seconds (ref 11.4–15.2)

## 2021-08-12 LAB — OSMOLALITY, URINE: Osmolality, Ur: 197 mOsm/kg — ABNORMAL LOW (ref 300–900)

## 2021-08-12 LAB — TROPONIN I (HIGH SENSITIVITY): Troponin I (High Sensitivity): 4 ng/L (ref <18)

## 2021-08-12 LAB — CBG MONITORING, ED: Glucose-Capillary: 138 mg/dL — ABNORMAL HIGH (ref 70–99)

## 2021-08-12 LAB — SODIUM, URINE, RANDOM: Sodium, Ur: 29 mmol/L

## 2021-08-12 MED ORDER — ASPIRIN 81 MG PO CHEW
81.0000 mg | CHEWABLE_TABLET | Freq: Every day | ORAL | Status: DC
  Administered 2021-08-12 – 2021-08-13 (×2): 81 mg via ORAL
  Filled 2021-08-12 (×2): qty 1

## 2021-08-12 MED ORDER — AMLODIPINE BESYLATE 10 MG PO TABS
10.0000 mg | ORAL_TABLET | Freq: Every day | ORAL | Status: DC
  Administered 2021-08-12 – 2021-08-13 (×2): 10 mg via ORAL
  Filled 2021-08-12: qty 1
  Filled 2021-08-12: qty 2

## 2021-08-12 MED ORDER — FAMOTIDINE IN NACL 20-0.9 MG/50ML-% IV SOLN
20.0000 mg | Freq: Once | INTRAVENOUS | Status: AC
Start: 2021-08-12 — End: 2021-08-12
  Administered 2021-08-12: 20 mg via INTRAVENOUS
  Filled 2021-08-12: qty 50

## 2021-08-12 MED ORDER — SODIUM CHLORIDE 0.9 % IV SOLN: 1.0000 g | INTRAVENOUS | Status: DC

## 2021-08-12 MED ORDER — OXYBUTYNIN CHLORIDE 5 MG PO TABS
5.0000 mg | ORAL_TABLET | Freq: Two times a day (BID) | ORAL | Status: DC
  Administered 2021-08-12 – 2021-08-13 (×3): 5 mg via ORAL
  Filled 2021-08-12 (×3): qty 1

## 2021-08-12 MED ORDER — LACTATED RINGERS IV BOLUS
1000.0000 mL | Freq: Once | INTRAVENOUS | Status: AC
  Administered 2021-08-12: 1000 mL via INTRAVENOUS

## 2021-08-12 MED ORDER — POLYETHYLENE GLYCOL 3350 17 G PO PACK
17.0000 g | PACK | Freq: Every day | ORAL | Status: DC | PRN
  Administered 2021-08-12: 17 g via ORAL

## 2021-08-12 MED ORDER — ATORVASTATIN CALCIUM 20 MG PO TABS
20.0000 mg | ORAL_TABLET | Freq: Every day | ORAL | Status: DC
  Administered 2021-08-12 – 2021-08-13 (×2): 20 mg via ORAL
  Filled 2021-08-12 (×2): qty 1

## 2021-08-12 MED ORDER — RISAQUAD PO CAPS
1.0000 | ORAL_CAPSULE | Freq: Every day | ORAL | Status: DC
  Administered 2021-08-12 – 2021-08-13 (×2): 1 via ORAL
  Filled 2021-08-12 (×2): qty 1

## 2021-08-12 MED ORDER — ACETAMINOPHEN 325 MG PO TABS: 650.0000 mg | ORAL_TABLET | Freq: Four times a day (QID) | ORAL | Status: DC | PRN

## 2021-08-12 MED ORDER — SODIUM CHLORIDE 0.9 % IV SOLN: INTRAVENOUS | Status: DC

## 2021-08-12 MED ORDER — FLEET ENEMA 7-19 GM/118ML RE ENEM
1.0000 | ENEMA | Freq: Once | RECTAL | Status: AC
  Administered 2021-08-12: 1 via RECTAL

## 2021-08-12 MED ORDER — POTASSIUM CHLORIDE CRYS ER 20 MEQ PO TBCR
40.0000 meq | EXTENDED_RELEASE_TABLET | Freq: Once | ORAL | Status: AC
Start: 2021-08-12 — End: 2021-08-12
  Administered 2021-08-12: 40 meq via ORAL
  Filled 2021-08-12: qty 2

## 2021-08-12 MED ORDER — INSULIN ASPART 100 UNIT/ML IJ SOLN: 0.0000 [IU] | Freq: Every day | INTRAMUSCULAR | Status: DC

## 2021-08-12 MED ORDER — ONDANSETRON HCL 4 MG/2ML IJ SOLN
4.0000 mg | Freq: Once | INTRAMUSCULAR | Status: AC
  Administered 2021-08-12: 4 mg via INTRAVENOUS
  Filled 2021-08-12: qty 2

## 2021-08-12 MED ORDER — OXYCODONE-ACETAMINOPHEN 5-325 MG PO TABS
1.0000 | ORAL_TABLET | ORAL | Status: DC | PRN
  Administered 2021-08-12: 1 via ORAL
  Filled 2021-08-12: qty 1

## 2021-08-12 MED ORDER — POLYETHYLENE GLYCOL 3350 17 G PO PACK
17.0000 g | PACK | Freq: Two times a day (BID) | ORAL | Status: DC
  Administered 2021-08-13: 17 g via ORAL
  Filled 2021-08-12: qty 1

## 2021-08-12 MED ORDER — METHOCARBAMOL 500 MG PO TABS
500.0000 mg | ORAL_TABLET | Freq: Three times a day (TID) | ORAL | Status: DC | PRN
Start: 2021-08-12 — End: 2021-08-13
  Administered 2021-08-12: 500 mg via ORAL
  Filled 2021-08-12 (×2): qty 1

## 2021-08-12 MED ORDER — ALUM & MAG HYDROXIDE-SIMETH 200-200-20 MG/5ML PO SUSP
30.0000 mL | Freq: Four times a day (QID) | ORAL | Status: DC | PRN
Start: 2021-08-12 — End: 2021-08-13
  Administered 2021-08-12 – 2021-08-13 (×2): 30 mL via ORAL
  Filled 2021-08-12 (×2): qty 30

## 2021-08-12 MED ORDER — SENNOSIDES-DOCUSATE SODIUM 8.6-50 MG PO TABS
1.0000 | ORAL_TABLET | Freq: Two times a day (BID) | ORAL | Status: DC
  Administered 2021-08-12 – 2021-08-13 (×3): 1 via ORAL
  Filled 2021-08-12 (×3): qty 1

## 2021-08-12 MED ORDER — CEFTRIAXONE SODIUM 1 G IJ SOLR
1.0000 g | Freq: Once | INTRAMUSCULAR | Status: AC
  Administered 2021-08-12: 1 g via INTRAVENOUS
  Filled 2021-08-12: qty 10

## 2021-08-12 MED ORDER — VITAMIN D3 25 MCG (1000 UNIT) PO TABS
2000.0000 [IU] | ORAL_TABLET | Freq: Every day | ORAL | Status: DC
  Administered 2021-08-12 – 2021-08-13 (×2): 2000 [IU] via ORAL
  Filled 2021-08-12 (×5): qty 2

## 2021-08-12 MED ORDER — ALPRAZOLAM 0.25 MG PO TABS
0.2500 mg | ORAL_TABLET | Freq: Two times a day (BID) | ORAL | Status: DC | PRN
  Administered 2021-08-12: 0.25 mg via ORAL
  Filled 2021-08-12: qty 1

## 2021-08-12 MED ORDER — ONDANSETRON HCL 4 MG/2ML IJ SOLN
4.0000 mg | Freq: Three times a day (TID) | INTRAMUSCULAR | Status: DC | PRN
Start: 2021-08-12 — End: 2021-08-13
  Administered 2021-08-12 – 2021-08-13 (×2): 4 mg via INTRAVENOUS
  Filled 2021-08-12 (×2): qty 2

## 2021-08-12 MED ORDER — HEPARIN SODIUM (PORCINE) 5000 UNIT/ML IJ SOLN
5000.0000 [IU] | Freq: Three times a day (TID) | INTRAMUSCULAR | Status: DC
  Administered 2021-08-12 – 2021-08-13 (×3): 5000 [IU] via SUBCUTANEOUS
  Filled 2021-08-12 (×4): qty 1

## 2021-08-12 MED ORDER — INSULIN ASPART 100 UNIT/ML IJ SOLN: 0.0000 [IU] | Freq: Three times a day (TID) | INTRAMUSCULAR | Status: DC

## 2021-08-12 MED ORDER — CALCIUM CITRATE-VITAMIN D 500-12.5 MG-MCG PO CHEW: CHEWABLE_TABLET | Freq: Every day | ORAL | Status: DC

## 2021-08-12 MED ORDER — ALBUTEROL SULFATE (2.5 MG/3ML) 0.083% IN NEBU
3.0000 mL | INHALATION_SOLUTION | RESPIRATORY_TRACT | Status: DC | PRN
Start: 2021-08-12 — End: 2021-08-13

## 2021-08-12 MED ORDER — ADULT MULTIVITAMIN W/MINERALS CH
1.0000 | ORAL_TABLET | Freq: Every day | ORAL | Status: DC
Start: 2021-08-12 — End: 2021-08-13
  Administered 2021-08-12 – 2021-08-13 (×2): 1 via ORAL
  Filled 2021-08-12 (×2): qty 1

## 2021-08-12 MED ORDER — IOHEXOL 300 MG/ML  SOLN
80.0000 mL | Freq: Once | INTRAMUSCULAR | Status: AC | PRN
  Administered 2021-08-12: 80 mL via INTRAVENOUS

## 2021-08-12 MED ORDER — HYDRALAZINE HCL 20 MG/ML IJ SOLN
5.0000 mg | INTRAMUSCULAR | Status: DC | PRN
  Filled 2021-08-12: qty 1

## 2021-08-12 MED ORDER — SODIUM CHLORIDE 1 G PO TABS
1.0000 g | ORAL_TABLET | Freq: Two times a day (BID) | ORAL | Status: DC
  Administered 2021-08-12 – 2021-08-13 (×2): 1 g via ORAL
  Filled 2021-08-12 (×3): qty 1

## 2021-08-12 MED ORDER — OYSTER SHELL CALCIUM/D3 500-5 MG-MCG PO TABS
1.0000 | ORAL_TABLET | Freq: Every day | ORAL | Status: DC
  Administered 2021-08-12 – 2021-08-13 (×2): 1 via ORAL
  Filled 2021-08-12 (×2): qty 1

## 2021-08-12 MED ORDER — PANTOPRAZOLE SODIUM 40 MG PO TBEC
40.0000 mg | DELAYED_RELEASE_TABLET | Freq: Every day | ORAL | Status: DC
  Administered 2021-08-12 – 2021-08-13 (×2): 40 mg via ORAL
  Filled 2021-08-12 (×2): qty 1

## 2021-08-12 NOTE — Assessment & Plan Note (Addendum)
Urine analysis showed ?

## 2021-08-12 NOTE — Assessment & Plan Note (Signed)
Lipitor 

## 2021-08-12 NOTE — Assessment & Plan Note (Addendum)
Likely due to poor oral intake and dehydration ?- Will check urine sodium, urine osmolality, serum osmolality. ?- Fluid restriction ?- IVF: 1L LR in ED, will continue with IV normal saline at 75 mL/h ?-Sodium chloride tablet 1 g twice daily ?- f/u by BMP q8h ?- avoid over correction too fast due to risk of central pontine myelinolysis ?

## 2021-08-12 NOTE — Assessment & Plan Note (Signed)
Recent A1c 6.2.  Well-controlled.  Patient is a taking metformin at home. ?-Sliding scale insulin ?

## 2021-08-12 NOTE — Assessment & Plan Note (Addendum)
-  Continue home Protonix ?-Give 1 dose of Pepcid 20 mg IV ?-As needed Mylanta ? ?

## 2021-08-12 NOTE — Assessment & Plan Note (Addendum)
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 No has bladder spasm and acute urinary retention.  Urinalysis is positive for nitrite otherwise not impressive. Per Dr. 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 CT scan showed moderate right hydronephrosis, but no new stone. Possibly due to sequela of a recently passed stone versus urinary tract infection.  Dr. Erlene Quan of urology is consulted. ? ?-will admitted to [facility] bed as inpatient ?-Rocephin was given in ED, will d/c abx ?-Start oxybutynin 5 mg twice daily ?-Follow-up with Dr. Cherrie Gauze  recommendations ? ? ?

## 2021-08-12 NOTE — Plan of Care (Signed)
Patient arrived to floor.  Oriented to room and call bell.  Family at bedside.  Bed alarm in use.   ?

## 2021-08-12 NOTE — Assessment & Plan Note (Signed)
-   IV hydralazine as needed ?-Hold Cozaar due to hyponatremia ?-Continue amlodipine ?

## 2021-08-12 NOTE — ED Provider Notes (Signed)
? ?[facility] ?Provider Note ? ? ? Event Date/Time  ? First MD Initiated Contact with Patient 08/12/21 [phone]   ?  (approximate) ? ? ?History  ? ?Post-op Problem ? ? ?HPI ? ?Anne Crosby is a 81 y.o. female with past medical history of HTN, HDL and recent cystoscopy ureteroscopy with stent placement on the right on 4/14 having had the stent removed yesterday who presents for evaluation of "bladder spasms" with some urinary frequency and "reflux" described as some burning epigastric and lower substernal pain starting last 24 hours.  She denies any fevers, cough, other chest pain, back pain, headache, earache, sore throat, diarrhea but she does endorse some constipation.  She states she took some Dulcolax and used 2 suppositories but only had a small amount of stool throughout this morning.  No blood in her urine or stool.  She is not on any opioids.  No other acute concerns at this time. ? ?  ?Past Medical History:  ?Diagnosis Date  ? Hyperlipemia   ? Hypertension   ? Kidney stones   ? ? ? ?Physical Exam  ?Triage Vital Signs: ?ED Triage Vitals  ?Enc Vitals Group  ?   BP 08/12/21 0641 134/81  ?   Pulse Rate 08/12/21 0641 96  ?   Resp 08/12/21 0641 19  ?   Temp --   ?   Temp src --   ?   SpO2 08/12/21 0641 100 %  ?   Weight 08/12/21 0636 145 lb (65.8 kg)  ?   Height 08/12/21 0636 5\' 3"  (1.6 m)  ?   Head Circumference --   ?   Peak Flow --   ?   Pain Score 08/12/21 0633 0  ?   Pain Loc --   ?   Pain Edu? --   ?   Excl. in [place]? --   ? ? ?Most recent vital signs: ?Vitals:  ? 08/12/21 0641 08/12/21 0702  ?BP: 134/81   ?Pulse: 96   ?Resp: 19   ?Temp:  98.6 ?F (37 ?C)  ?SpO2: 100%   ? ? ?General: Awake, no distress.  ?CV:  Slightly prolonged capillary refill in the digits.  2+ radial pulses. ?Resp:  Normal effort.  Clear bilaterally. ?Abd:  No distention.  Mild tenderness in the suprapubic region but otherwise soft throughout.  No significant CVA tenderness. ?Other:   ? ? ?ED Results / Procedures /  Treatments  ?Labs ?(all labs ordered are listed, but only abnormal results are displayed) ?Labs Reviewed  ?CBC WITH DIFFERENTIAL/PLATELET - Abnormal; Notable for the following components:  ?    Result Value  ? Neutro Abs 8.4 (*)   ? All other components within normal limits  ?COMPREHENSIVE METABOLIC PANEL - Abnormal; Notable for the following components:  ? Sodium 122 (*)   ? Potassium 3.4 (*)   ? Chloride 87 (*)   ? Glucose, Bld 153 (*)   ? All other components within normal limits  ?URINALYSIS, ROUTINE W REFLEX MICROSCOPIC - Abnormal; Notable for the following components:  ? Color, Urine AMBER (*)   ? APPearance CLEAR (*)   ? Hgb urine dipstick MODERATE (*)   ? Nitrite POSITIVE (*)   ? Bacteria, UA RARE (*)   ? All other components within normal limits  ?URINE CULTURE  ?LIPASE, BLOOD  ?MAGNESIUM  ?SODIUM, URINE, RANDOM  ?OSMOLALITY, URINE  ?OSMOLALITY  ?TROPONIN I (HIGH SENSITIVITY)  ? ? ? ?EKG ? ?ECGs remarkable for sinus  rhythm with a ventricular rate of 98, normal axis, nonspecific ST change in lead III without any other clear evidence of acute ischemia or significant arrhythmia. ? ? ?RADIOLOGY ? ?CT abdomen pelvis and interpretation with very distended bladder as well as some right-sided hydronephrosis.  I do not see evidence of diverticulitis, abscess or SBO.  I reviewed radiology interpretation and agree to findings of moderate right-sided hydronephrosis with some urothelial enhancement without any visible right-sided renal or ureteral stones as well as distention of the bladder and multiple punctate nonobstructing stones within the upper pole of the left kidney as well as diverticulosis, small moderate hiatal hernia and aortic atherosclerosis.  ? ? ?PROCEDURES: ? ?Critical Care performed: Yes, see critical care procedure note(s) ? ?.1-3 Lead EKG Interpretation ?Performed by: Gilles ChiquitoSmith, Roniel Halloran P, MD ?Authorized by: Gilles ChiquitoSmith, Brendy Ficek P, MD  ? ?  Interpretation: normal   ?  ECG rate assessment: normal   ?  Rhythm:  sinus rhythm   ?  Ectopy: none   ?  Conduction: normal   ?.Critical Care ?Performed by: Gilles ChiquitoSmith, Sarah Zerby P, MD ?Authorized by: Gilles ChiquitoSmith, Shadee Rathod P, MD  ? ?Critical care provider statement:  ?  Critical care time (minutes):  30 ?  Critical care was necessary to treat or prevent imminent or life-threatening deterioration of the following conditions:  Metabolic crisis (hyponatremia requiring admission) ?  Critical care was time spent personally by me on the following activities:  Development of treatment plan with patient or surrogate, discussions with consultants, evaluation of patient's response to treatment, examination of patient, ordering and review of laboratory studies, ordering and review of radiographic studies, ordering and performing treatments and interventions, pulse oximetry, re-evaluation of patient's condition and review of old charts ?The patient is on the cardiac monitor to evaluate for evidence of arrhythmia and/or significant heart rate changes. ? ? ?MEDICATIONS ORDERED IN ED: ?Medications  ?0.9 %  sodium chloride infusion ( Intravenous Not Given 08/12/21 0806)  ?lactated ringers bolus 1,000 mL (1,000 mLs Intravenous New Bag/Given 08/12/21 0802)  ?cefTRIAXone (ROCEPHIN) 1 g in sodium chloride 0.9 % 100 mL IVPB (1 g Intravenous New Bag/Given 08/12/21 0805)  ?potassium chloride SA (KLOR-CON M) CR tablet 40 mEq (40 mEq Oral Given 08/12/21 0806)  ?ondansetron [facility](ZOFRAN) injection 4 mg (4 mg Intravenous Given 08/12/21 0803)  ?iohexol (OMNIPAQUE) 300 MG/ML solution 80 mL (80 mLs Intravenous Contrast Given 08/12/21 0812)  ? ? ? ?IMPRESSION / MDM / ASSESSMENT AND PLAN / ED COURSE  ?I reviewed the triage vital signs and the nursing notes. ?             ?               ? ?Differential diagnosis includes, but is not limited to atypical angina, SBO, cystitis, diverticulitis, metabolic derangements, dehydration and symptomatic constipation in the setting of recent stent placement. ? ?EKG and nonelevated troponin not  suggestive of ACS. ? ?CMP is remarkable for sodium of 122, K of 3.4, chloride of 87 without any other significant electrolyte or metabolic derangements.  No evidence of cholestatic process or hepatitis.  Lipase not suggestive of acute pancreatitis.  UA without leukocytosis or acute anemia.  UA remarkable for hemoglobin and is positive for nitrites which per urologist Dr. Vanna ScotlandAshley Brandon does not necessarily reflect infection given Azo can cause this to be positive.  Urine culture sent.  Magnesium 1.7.  Serum sodium 29.  I think somewhat difficult to entirely exclude infection at this time although I do not  think patient is septic.  She was given 1 dose of Rocephin and urine culture sent. ? ?CT abdomen pelvis and interpretation with very distended bladder as well as some right-sided hydronephrosis.  I do not see evidence of diverticulitis, abscess or SBO.  I reviewed radiology interpretation and agree to findings of moderate right-sided hydronephrosis with some urothelial enhancement without any visible right-sided renal or ureteral stones as well as distention of the bladder and multiple punctate nonobstructing stones within the upper pole of the left kidney as well as diverticulosis, small moderate hiatal hernia and aortic atherosclerosis.  ? ?I suspect patient's constipation complaints are likely related to urinary tract infection and bladder retention.  Following CT postvoid residual bladder scan after patient attempted to urinate showed greater than 999 cc of urine.  Foley catheter placed.  Patient started on antibiotics.  I do not believe she is septic at this time. ? ?Per Dr. Hollice Espy no indication for other emergent urological procedures or intervention at this time. ? ?We will plan to admit to medicine service for further evaluation management of acute hyponatremia (CMP from 3/18 showed a sodium of 135) and acute urinary tension.  I discussed this with admitting hospitalist will place admission  orders. ?  ? ? ?FINAL CLINICAL IMPRESSION(S) / ED DIAGNOSES  ? ?Final diagnoses:  ?Hyponatremia  ?Hypokalemia  ?Urinary retention  ?Diverticulosis  ?Aortic atherosclerosis ([place])  ? ? ? ?Rx / DC Orders  ? ?ED Discharg

## 2021-08-12 NOTE — Assessment & Plan Note (Addendum)
-   MiraLAX and senkot ?-give Fleet enema ?

## 2021-08-12 NOTE — Assessment & Plan Note (Signed)
K=3.4. Mg 1.7 ?-Repleted potassium ?

## 2021-08-12 NOTE — Assessment & Plan Note (Signed)
-   Foley catheter placed in ED ?

## 2021-08-12 NOTE — Consult Note (Signed)
?    ? ?Urology Consult ? ?I have been asked to see the patient by Dr. Katrinka Blazing, for evaluation and management of right hydronephrosis/urinary retention/possible UTI ? ?Chief Complaint: urinary  ? ?History of Present Illness: Anne Crosby is a 81 y.o. year old female presenting to the emergency room with right flank pain following stent removal yesterday.  She underwent right ureteroscopy with laser lithotripsy at [facility] and had her stent removed yesterday in the office on a tether.   ? ?She reports over the last 5 days, she had issues with severe constipation.  Has not had a BM.  She called the urologist office and was recommended to drink prune juice, try MiraLAX as well as 2 Dulcolax suppositories now which was effective.  She still not had a bowel movement yet. ? ?In addition to the above, she had her stent removed yesterday.  Following the stent removal, started having trouble emptying her bladder.  She is having severe bladder spasms, going to the bathroom every hour or more but voiding very small amounts.  She became very uncomfortable and ultimately presented to the emergency room.  She denies any severe right flank pain, reports that it is dull aching and mild on the right side. ? ?CT scan indicates right hydronephrosis and urothelial enhancement along with a massively distended bladder.  A Foley catheter was placed for urinary retention. ? ?No fevers or chills at home.  Her labs and vitals are stable here.  Her urinalysis only has 11-20 red blood cells however is a nitrite positive, possibly related to medication.  Urine culture is pending. ? ?She does report that she has been taking Azo on a regular basis. ? ?She has been given a dose of IV ceftriaxone and admitted to the hospitalist for supportive care. ? ?Past Medical History:  ?Diagnosis Date  ? Hyperlipemia   ? Hypertension   ? Kidney stones   ? ? ?Past Surgical History:  ?Procedure Laterality Date  ? ABDOMINAL HYSTERECTOMY    ? CATARACT EXTRACTION     ? COLON SURGERY    ? kidney stone removal    ? ? ?Home Medications:  ?Current Meds  ?Medication Sig  ? amLODipine (NORVASC) 10 MG tablet Take 10 mg by mouth daily.  ? aspirin 81 MG tablet Take 81 mg by mouth daily.  ? atorvastatin (LIPITOR) 20 MG tablet Take 20 mg by mouth daily.  ? bifidobacterium infantis (ALIGN) capsule Take 1 capsule by mouth daily.  ? Calcium Citrate-Vitamin D (CALCIUM CITRATE CHEWY BITE PO) Take 1 tablet by mouth daily.  ? Cholecalciferol (VITAMIN D) 50 MCG (2000 UT) CAPS Take 2,000 Units by mouth daily.  ? losartan (COZAAR) 50 MG tablet Take 50 mg by mouth daily.  ? metFORMIN (GLUCOPHAGE-XR) 500 MG 24 hr tablet TAKE 1 TABLET BY MOUTH EVERY DAY WITH DINNER  ? Multiple Vitamin (MULTIVITAMIN ADULT PO) Take 1 tablet by mouth daily.  ? pantoprazole (PROTONIX) 40 MG tablet Take 40 mg by mouth daily.  ? potassium bicarbonate (K-LYTE) 25 MEQ disintegrating tablet Take 1 tablet by mouth daily.  ? tamsulosin (FLOMAX) 0.4 MG CAPS capsule Take 0.4 mg by mouth daily.  ? ? ?Allergies:  ?Allergies  ?Allergen Reactions  ? Ciprofloxacin   ?  Other reaction(s): Other (See Comments) ?tendonitis  ? Diclofenac Diarrhea  ? Doxycycline   ?  Other reaction(s): Other (See Comments)  ? Elemental Sulfur Hives  ? Sulfa Antibiotics Rash  ? ? ?Family History  ?Problem Relation Age  of Onset  ? Breast cancer Mother 88  ? Cancer Mother   ? Alzheimer's disease Mother   ? Stroke Mother   ? Heart disease Father   ? Stroke Father   ? ? ?Social History:  reports that she has never smoked. She has never used smokeless tobacco. She reports that she does not drink alcohol and does not use drugs. ? ?ROS: ?A complete review of systems was performed.  All systems are negative except for pertinent findings as noted. ? ?Physical Exam:  ?Vital signs in last 24 hours: ?Temp:  [97.9 ?F (36.6 ?C)-98.6 ?F (37 ?C)] 97.9 ?F (36.6 ?C) (04/19 1521) ?Pulse Rate:  [77-96] 81 (04/19 1521) ?Resp:  [17-19] 18 (04/19 1521) ?BP: (119-147)/(65-81)  119/65 (04/19 1521) ?SpO2:  [94 %-100 %] 94 % (04/19 1521) ?Weight:  [65.8 kg] 65.8 kg (04/19 0636) ?Constitutional:  Alert and oriented, No acute distress.  Eating dinner.  Female family member at bedside. ?HEENT: [place] AT, moist mucus membranes.  Trachea midline, no masses ?GI: Abdomen is soft, nontender, nondistended, no abdominal masses ?GU: No CVA tenderness.  Foley catheter drained clear yellow urine. ?Skin: No rashes, bruises or suspicious lesions ?Neurologic: Grossly intact, no focal deficits, moving all 4 extremities ?Psychiatric: Normal mood and affect ? ? ?Laboratory Data:  ?Recent Labs  ?  08/12/21 ?[id number]  ?WBC 10.0  ?HGB 13.6  ?HCT 41.5  ? ?Recent Labs  ?  08/12/21 ?0642 08/12/21 ?1125  ?NA 122* 123*  ?K 3.4* 3.8  ?CL 87* 90*  ?CO2 25 24  ?GLUCOSE 153* 138*  ?BUN 13 10  ?CREATININE 0.75 0.57  ?CALCIUM 9.1 8.2*  ? ?Recent Labs  ?  08/12/21 ?0642  ?INR 1.0  ? ?Component ?    Latest Ref Rng 08/12/2021  ?Color, Urine ?    YELLOW  AMBER !   ?Appearance ?    CLEAR  CLEAR !   ?Specific Gravity, Urine ?    1.005 - 1.030  1.005   ?pH ?    5.0 - 8.0  7.0   ?Glucose, UA ?    NEGATIVE mg/dL NEGATIVE   ?Hgb urine dipstick ?    NEGATIVE  MODERATE !   ?Bilirubin Urine ?    NEGATIVE  NEGATIVE   ?Ketones, ur ?    NEGATIVE mg/dL NEGATIVE   ?Protein ?    NEGATIVE mg/dL NEGATIVE   ?Nitrite ?    NEGATIVE  POSITIVE !   ?Leukocytes,UA ?    NEGATIVE    ?Squamous Epithelial / LPF ?    0 - 5  0-5   ?WBC, UA ?    0 - 5 WBC/hpf 0-5   ?RBC / HPF ?    0 - 5 RBC/hpf 11-20   ?Bacteria, UA ?    NONE SEEN  RARE !   ?Leukocytes,Ua ?    NEGATIVE  NEGATIVE   ?Mucus PRESENT   ?  ?! Abnormal ? ?Radiologic Imaging: ?CT ABDOMEN PELVIS W CONTRAST ? ?Result Date: 08/12/2021 ?CLINICAL DATA:  Abdominal pain, acute, nonlocalized EXAM: CT ABDOMEN AND PELVIS WITH CONTRAST TECHNIQUE: Multidetector CT imaging of the abdomen and pelvis was performed using the standard protocol following bolus administration of intravenous contrast. RADIATION DOSE REDUCTION:  This exam was performed according to the departmental dose-optimization program which includes automated exposure control, adjustment of the mA and/or kV according to patient size and/or use of iterative reconstruction technique. CONTRAST:  69mL OMNIPAQUE IOHEXOL 300 MG/ML  SOLN COMPARISON:  None. FINDINGS: Lower chest:  Minor atelectasis.  Heart size is normal. Hepatobiliary: No focal liver abnormality is seen. No gallstones, gallbladder wall thickening, or biliary dilatation. Pancreas: Unremarkable. No pancreatic ductal dilatation or surrounding inflammatory changes. Spleen: Normal in size without focal abnormality. Adrenals/Urinary Tract: Unremarkable adrenal glands. Moderate right hydronephrosis. Urothelial enhancement of the right renal collecting system and throughout the length of the right ureter. No right-sided renal or ureteral calculi are identified. Numerous pelvic phleboliths slightly limited ability to detect small distal ureteral calculi. There are numerous punctate nonobstructing stones within the upper pole of the left kidney. No left-sided hydronephrosis. No left ureteral calculi. Marked distention of the urinary bladder to the of the umbilicus. Small cystocele. Bladder is low lying within the pelvis compatible with a degree of pelvic floor laxity. Stomach/Bowel: Small-moderate-sized hiatal hernia. Stomach appears otherwise within normal limits. From no dilated loops of bowel. Numerous diverticula throughout the colon. No focal bowel wall thickening or inflammatory changes. A normal appendix is seen in the right lower quadrant. Vascular/Lymphatic: Scattered aortoiliac atherosclerotic calcifications without aneurysm. No abdominopelvic lymphadenopathy. Reproductive: Status post hysterectomy. No adnexal masses. Other: No free fluid. No abdominopelvic fluid collection. No pneumoperitoneum. No abdominal wall hernia. Musculoskeletal: Previous right total hip arthroplasty. Remote ununited left L4  transverse process fracture. 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 Multilevel facet arthropathy and degenerative

## 2021-08-12 NOTE — ED Triage Notes (Addendum)
Patient ambulatory to triage with steady gait, without difficulty, pt appears very anxious; reports she had her ureteral stent removed yesterday by [facility]; c/o intermittent bladder spasms this morning; denies any pain at present; reports her urologist told her the spasms could be from constipation and that she used 2 suppositories with small results ?

## 2021-08-12 NOTE — H&P (Addendum)
?History and Physical  ? ? ?Anne Crosby [id number] DOB: Nov 10, 1940 DOA: 08/12/2021 ? ?Referring MD/NP/PA:  ? ?[place]  ? ?Patient coming from:  The patient is coming from home.  At baseline, pt is independent for most of ADL.       ? ?Chief Complaint: bladder spasms, right flank pain ? ?HPI: Anne Crosby is a 81 y.o. female with medical history significant of hypertension, hyperlipidemia, diabetes mellitus, GERD, kidney stone, who presents with bladder spasm and right flank pain ? ?Due to mild right hydronephrosis with an obstructing 15 mm stone in the distal right ureter, pt underwent recent cystoscopy ureter[facility] with stent [facility] on [facility], having had the stent removed yesterday.  ? ?Pt states that started feeling bladder spasm, burning on urination since yesterday, no dysuria or hematuria. She has right sided flank pan and back pain. She states she has indigestion and acid reflux symptoms. Patient has nausea and vomited little bit with nonbilious nonbloody vomiting.  No abdominal pain.  Denies chest pain, cough, shortness of breath with no fever. Pt has chills and shaking. She is anxious. States that she is constipated. She took some Dulcolax and used 2 suppositories but only had a small amount of stool throughout this morning. Pt was found to have acute urinary retention, Foley catheter is placed in ED. ? ? ?Data Reviewed and ED Course: pt was found to have WBC 10.0, troponin level 4, urinalysis (clear appearance, negative leukocyte, positive nitrite, rare bacteria, WBC 0-5), GFR> 60, sodium 122, potassium 3.4, magnesium 1.7, temperature normal, blood pressure 134/81, heart rate 96, 19, oxygen saturation 100% on room air.  Patient is admitted to MedSurg bed as inpatient.  Dr. Apolinar JunesBrandon of urology is consulted. ? ? ?CT abdomen/pelvis: ?1. Moderate right hydronephrosis with associated urothelial ?enhancement of the right renal collecting system  and throughout the ?length of the right ureter. No right-sided renal or ureteral calculi ?are identified. Findings may represent sequela of a recently passed ?stone versus urinary tract infection. Correlate with urinalysis. ?2. Marked distention of the urinary bladder to the of the umbilicus. ?3. Multiple punctate nonobstructing stones within the upper pole of ?the left kidney. ?4. Colonic diverticulosis without evidence of acute diverticulitis. ?5. Small-moderate-sized hiatal hernia. ?6. Aortic Atherosclerosis (ICD10-I70.0). ? ? ? ?EKG: I have personally reviewed.  Sinus rhythm, QTc 460, LAE, LAD. ? ? ?Review of Systems:  ? ?General: no fevers, has chills, no body weight gain, has poor appetite, has fatigue ?HEENT: no blurry vision, hearing changes or sore throat ?Respiratory: no dyspnea, coughing, wheezing ?CV: no chest pain, no palpitations ?GI: has nausea, vomiting, acid reflux,  constipation, no abdominal pain ?GU: no dysuria, has burning on urination, no increased urinary frequency, hematuria  ?Ext: no leg edema ?Neuro: no unilateral weakness, numbness, or tingling, no vision change or hearing loss ?Skin: no rash, no skin tear. ?MSK: has right flank pain and back pain ?Heme: No easy bruising.  ?Travel history: No recent long distant travel. ? ? ?Allergy:  ?Allergies  ?Allergen Reactions  ? Ciprofloxacin   ?  Other reaction(s): Other (See Comments) ?tendonitis  ? Diclofenac Diarrhea  ? Doxycycline   ?  Other reaction(s): Other (See Comments)  ? Elemental Sulfur Hives  ? Sulfa Antibiotics Rash  ? ? ?Past Medical History:  ?Diagnosis Date  ? Hyperlipemia   ? Hypertension   ? Kidney stones   ? ? ?Past Surgical History:  ?Procedure Laterality Date  ?  ABDOMINAL HYSTERECTOMY    ? CATARACT EXTRACTION    ? COLON SURGERY    ? kidney stone removal    ? ? ?Social History:  reports that she has never smoked. She has never used smokeless tobacco. She reports that she does not drink alcohol and does not use drugs. ? ?Family  History:  ?Family History  ?Problem Relation Age of Onset  ? Breast cancer Mother 86  ? Cancer Mother   ? Alzheimer's disease Mother   ? Stroke Mother   ? Heart disease Father   ? Stroke Father   ?  ? ?Prior to Admission medications   ?Medication Sig Start Date End Date Taking? Authorizing Provider  ?alendronate (FOSAMAX) 70 MG tablet Take 70 mg by mouth once a week. Take with a full glass of water on an empty stomach.    [provider]  ?amLODipine (NORVASC) 10 MG tablet Take 10 mg by mouth daily.    [provider]  ?aspirin 81 MG tablet Take 81 mg by mouth daily.    [provider]  ?atorvastatin (LIPITOR) 20 MG tablet Take 20 mg by mouth daily.    [provider]  ?bifidobacterium infantis (ALIGN) capsule Take by mouth. 05/09/19   [provider]  ?cefUROXime (CEFTIN) 500 MG tablet SMARTSIG:1 Tablet(s) By Mouth Every 12 Hours 10/25/19   [provider]  ?Cholecalciferol (VITAMIN D) 50 MCG (2000 UT) CAPS  05/19/09   [provider]  ?ipratropium (ATROVENT) 0.06 % nasal spray Place 2 sprays into both nostrils 4 (four) times daily as needed. 03/10/18   Tommie Sams, DO  ?losartan (COZAAR) 50 MG tablet Take 50 mg by mouth daily.    [provider]  ?metFORMIN (GLUCOPHAGE-XR) 500 MG 24 hr tablet TAKE 1 TABLET BY MOUTH EVERY DAY WITH DINNER 03/11/21   [provider]  ?Multiple Vitamin (MULTIVITAMIN ADULT PO) Take 1 tablet by mouth daily. 08/10/06   [provider]  ?multivitamin-lutein (OCUVITE-LUTEIN) CAPS capsule Take 1 capsule by mouth daily.    [provider]  ?pantoprazole (PROTONIX) 40 MG tablet Take 40 mg by mouth daily. 01/09/21   [provider]  ?Potassium Citrate 15 MEQ (1620 MG) TBCR Take by mouth. 10/24/19   [provider]  ? ? ?Physical Exam: ?Vitals:  ? 08/12/21 0702 08/12/21 0936 08/12/21 1200 08/12/21 1521  ?BP:  (!) 147/74 125/67 119/65  ?Pulse:  88 77 81  ?Resp:  17 18 18   ?Temp: 98.6  ?F (37 ?C)   97.9 ?F (36.6 ?C)  ?TempSrc: Oral   Oral  ?SpO2:  98% 96% 94%  ?Weight:      ?Height:      ? ?General: Not in acute distress ?HEENT: ?      Eyes: PERRL, EOMI, no scleral icterus. ?      ENT: No discharge from the ears and nose, no pharynx injection, no tonsillar enlargement.  ?      Neck: No JVD, no bruit, no mass felt. ?Heme: No neck lymph node enlargement. ?Cardiac: S1/S2, RRR, No murmurs, No gallops or rubs. ?Respiratory: No rales, wheezing, rhonchi or rubs. ?GI: Soft, nondistended, nontender, no rebound pain, no organomegaly, BS present. ?GU: No hematuria ?Ext: No pitting leg edema bilaterally. 1+DP/PT pulse bilaterally. ?Musculoskeletal: has positive right CVA tenderness ?Skin: No rashes.  ?Neuro: Alert, oriented X3, cranial nerves II-XII grossly intact, moves all extremities normally.  ?Psych: Patient is not psychotic, no suicidal or hemocidal ideation. ? ?Labs on Admission: I  have personally reviewed following labs and imaging studies ? ?CBC: ?Recent Labs  ?Lab 08/12/21 ?9373  ?WBC 10.0  ?NEUTROABS 8.4*  ?HGB 13.6  ?HCT 41.5  ?MCV 89.1  ?PLT 302  ? ?Basic Metabolic Panel: ?Recent Labs  ?Lab 08/12/21 ?4287 08/12/21 ?1125  ?NA 122* 123*  ?K 3.4* 3.8  ?CL 87* 90*  ?CO2 25 24  ?GLUCOSE 153* 138*  ?BUN 13 10  ?CREATININE 0.75 0.57  ?CALCIUM 9.1 8.2*  ?MG 1.7  --   ? ?GFR: ?Estimated Creatinine Clearance: 50.3 mL/min (by C-G formula based on SCr of 0.57 mg/dL). ?Liver Function Tests: ?Recent Labs  ?Lab 08/12/21 ?6811  ?AST 29  ?ALT 22  ?ALKPHOS 52  ?BILITOT 1.0  ?PROT 7.7  ?ALBUMIN 4.6  ? ?Recent Labs  ?Lab 08/12/21 ?5726  ?LIPASE 24  ? ?No results for input(s): AMMONIA in the last 168 hours. ?Coagulation Profile: ?Recent Labs  ?Lab 08/12/21 ?2035  ?INR 1.0  ? ?Cardiac Enzymes: ?No results for input(s): CKTOTAL, CKMB, CKMBINDEX, TROPONINI in the last 168 hours. ?BNP (last 3 results) ?No results for input(s): PROBNP in the last 8760 hours. ?HbA1C: ?No results for input(s): HGBA1C in the last 72  hours. ?CBG: ?Recent Labs  ?Lab 08/12/21 ?1221 08/12/21 ?1630  ?GLUCAP 138* 122*  ? ?Lipid Profile: ?No results for input(s): CHOL, HDL, LDLCALC, TRIG, CHOLHDL, LDLDIRECT in the last 72 hours. ?Thyroid Function

## 2021-08-13 ENCOUNTER — Inpatient Hospital Stay: Payer: Medicare Other

## 2021-08-13 DIAGNOSIS — R339 Retention of urine, unspecified: Secondary | ICD-10-CM | POA: Diagnosis present

## 2021-08-13 DIAGNOSIS — N1330 Unspecified hydronephrosis: Secondary | ICD-10-CM | POA: Diagnosis not present

## 2021-08-13 DIAGNOSIS — N1339 Other hydronephrosis: Secondary | ICD-10-CM | POA: Diagnosis not present

## 2021-08-13 LAB — GLUCOSE, CAPILLARY
Glucose-Capillary: 118 mg/dL — ABNORMAL HIGH (ref 70–99)
Glucose-Capillary: 108 mg/dL — ABNORMAL HIGH (ref 70–99)

## 2021-08-13 LAB — BASIC METABOLIC PANEL
Anion gap: 7 (ref 5–15)
Anion gap: 7 (ref 5–15)
BUN: 11 mg/dL (ref 8–23)
BUN: 10 mg/dL (ref 8–23)
CO2: 27 mmol/L (ref 22–32)
CO2: 26 mmol/L (ref 22–32)
Calcium: 9.4 mg/dL (ref 8.9–10.3)
Calcium: 9.1 mg/dL (ref 8.9–10.3)
Chloride: 100 mmol/L (ref 98–111)
Chloride: 103 mmol/L (ref 98–111)
Creatinine, Ser: 0.72 mg/dL (ref 0.44–1.00)
Creatinine, Ser: 0.86 mg/dL (ref 0.44–1.00)
GFR, Estimated: >60 mL/min (ref >60)
GFR, Estimated: >60 mL/min (ref >60)
Glucose, Bld: 115 mg/dL — ABNORMAL HIGH (ref 70–99)
Glucose, Bld: 175 mg/dL — ABNORMAL HIGH (ref 70–99)
Potassium: 3.9 mmol/L (ref 3.5–5.1)
Potassium: 4.2 mmol/L (ref 3.5–5.1)
Sodium: 134 mmol/L — ABNORMAL LOW (ref 135–145)
Sodium: 136 mmol/L (ref 135–145)

## 2021-08-13 LAB — URINE CULTURE: Culture: NO GROWTH

## 2021-08-13 MED ORDER — MAGNESIUM HYDROXIDE 400 MG/5ML PO SUSP: 30.0000 mL | Freq: Every day | ORAL | Status: DC

## 2021-08-13 MED ORDER — SIMETHICONE 80 MG PO CHEW: 80.0000 mg | CHEWABLE_TABLET | Freq: Four times a day (QID) | ORAL | Status: DC

## 2021-08-13 MED ORDER — CHLORHEXIDINE GLUCONATE CLOTH 2 % EX PADS
6.0000 | MEDICATED_PAD | Freq: Every day | CUTANEOUS | Status: DC
  Administered 2021-08-13: 6 via TOPICAL

## 2021-08-13 MED ORDER — POLYETHYLENE GLYCOL 3350 17 G PO PACK: 17.0000 g | PACK | Freq: Every day | ORAL | 0 refills | Status: AC | PRN

## 2021-08-13 NOTE — Progress Notes (Signed)
Discharge instructions reviewed with patient and son at the bedside. Education on foley and foley care provided. Patient and family member state they have no further questions or concerns at this time. PIV removed with no complications.  ?

## 2021-08-13 NOTE — Progress Notes (Signed)
Urology Inpatient Progress Note ? ?Subjective: ?No acute events overnight.  He is afebrile, VSS.  She had an enema yesterday evening and has been drinking prune juice but has not yet had a BM. ?Creatinine stable, 0.72.  Urine culture pending. ?She reports feeling significantly better today. ?Foley catheter in place draining clear, yellow urine. ? ?Anti-infectives: ?Anti-infectives (From admission, onward)  ? ? Start     Dose/Rate Route Frequency Ordered Stop  ? 08/13/21 0800  cefTRIAXone (ROCEPHIN) 1 g in sodium chloride 0.9 % 100 mL IVPB  Status:  Discontinued       ? 1 g ?200 mL/hr over 30 Minutes Intravenous Every 24 hours 08/12/21 0945 08/12/21 1709  ? 08/12/21 0730  cefTRIAXone (ROCEPHIN) 1 g in sodium chloride 0.9 % 100 mL IVPB       ? 1 g ?200 mL/hr over 30 Minutes Intravenous  Once 08/12/21 0717 08/12/21 0900  ? ?  ? ? ?Current Facility-Administered Medications  ?Medication Dose Route Frequency Provider Last Rate Last Admin  ? 0.9 %  sodium chloride infusion   Intravenous Continuous Lorretta Harp, MD 75 mL/hr at 08/13/21 0540 Infusion Verify at 08/13/21 0540  ? acetaminophen (TYLENOL) tablet 650 mg  650 mg Oral Q6H PRN Lorretta Harp, MD      ? acidophilus (RISAQUAD) capsule 1 capsule  1 capsule Oral Daily Lorretta Harp, MD   1 capsule at 08/13/21 6195  ? albuterol (PROVENTIL) (2.5 MG/3ML) 0.083% nebulizer solution 3 mL  3 mL Inhalation Q4H PRN Lorretta Harp, MD      ? ALPRAZolam Prudy Feeler) tablet 0.25 mg  0.25 mg Oral BID PRN Lorretta Harp, MD   0.25 mg at 08/12/21 0941  ? alum & mag hydroxide-simeth (MAALOX/MYLANTA) 200-200-20 MG/5ML suspension 30 mL  30 mL Oral Q6H PRN Lorretta Harp, MD   30 mL at 08/13/21 0227  ? amLODipine (NORVASC) tablet 10 mg  10 mg Oral Daily Lorretta Harp, MD   10 mg at 08/13/21 0932  ? aspirin chewable tablet 81 mg  81 mg Oral Daily Lorretta Harp, MD   81 mg at 08/13/21 6712  ? atorvastatin (LIPITOR) tablet 20 mg  20 mg Oral Daily Lorretta Harp, MD   20 mg at 08/13/21 4580  ? calcium-vitamin D (OSCAL WITH D)  500-5 MG-MCG per tablet 1 tablet  1 tablet Oral Daily Angelique Blonder, RPH   1 tablet at 08/13/21 9983  ? Chlorhexidine Gluconate Cloth 2 % PADS 6 each  6 each Topical Q0600 Lorretta Harp, MD   6 each at 08/13/21 [phone]  ? cholecalciferol (VITAMIN D) tablet 2,000 Units  2,000 Units Oral Daily Lorretta Harp, MD   2,000 Units at 08/13/21 0539  ? heparin injection 5,000 Units  5,000 Units Subcutaneous Alean Rinne, MD   5,000 Units at 08/13/21 7673  ? hydrALAZINE (APRESOLINE) injection 5 mg  5 mg Intravenous Q2H PRN Lorretta Harp, MD      ? insulin aspart (novoLOG) injection 0-5 Units  0-5 Units Subcutaneous QHS Lorretta Harp, MD      ? insulin aspart (novoLOG) injection 0-9 Units  0-9 Units Subcutaneous TID WC Lorretta Harp, MD      ? methocarbamol (ROBAXIN) tablet 500 mg  500 mg Oral Q8H PRN Lorretta Harp, MD   500 mg at 08/12/21 1032  ? multivitamin with minerals tablet 1 tablet  1 tablet Oral Daily Lorretta Harp, MD   1 tablet at 08/13/21 4193  ? ondansetron (ZOFRAN) injection 4 mg  4 mg Intravenous  Q8H PRN Lorretta Harp, MD   4 mg at 08/13/21 0235  ? oxybutynin (DITROPAN) tablet 5 mg  5 mg Oral BID Lorretta Harp, MD   5 mg at 08/13/21 4970  ? oxyCODONE-acetaminophen (PERCOCET/ROXICET) 5-325 MG per tablet 1 tablet  1 tablet Oral Q4H PRN Lorretta Harp, MD   1 tablet at 08/12/21 1222  ? pantoprazole (PROTONIX) EC tablet 40 mg  40 mg Oral Daily Lorretta Harp, MD   40 mg at 08/13/21 2637  ? polyethylene glycol (MIRALAX / GLYCOLAX) packet 17 g  17 g Oral BID Lorretta Harp, MD   17 g at 08/13/21 0813  ? senna-docusate (Senokot-S) tablet 1 tablet  1 tablet Oral BID Lorretta Harp, MD   1 tablet at 08/13/21 8588  ? sodium chloride tablet 1 g  1 g Oral BID WC Lorretta Harp, MD   1 g at 08/13/21 5027  ? ?Objective: ?Vital signs in last 24 hours: ?Temp:  [97.9 ?F (36.6 ?C)-98.9 ?F (37.2 ?C)] 98.9 ?F (37.2 ?C) (04/20 0754) ?Pulse Rate:  [65-91] 91 (04/20 0754) ?Resp:  [16-18] 18 (04/20 0754) ?BP: (102-147)/(53-74) 114/63 (04/20 0754) ?SpO2:  [90 %-98 %] 93 % (04/20  0754) ? ?Intake/Output from previous day: ?04/19 0701 - 04/20 0700 ?In: 887 [P.O.:358; I.V.:429; IV Piggyback:100] ?Out: 6800 [Urine:6800] ?Intake/Output this shift: ?No intake/output data recorded. ? ?Physical Exam ?Vitals and nursing note reviewed.  ?Constitutional:   ?   General: She is not in acute distress. ?   Appearance: She is not ill-appearing, toxic-appearing or diaphoretic.  ?HENT:  ?   Head: Normocephalic and atraumatic.  ?Pulmonary:  ?   Effort: Pulmonary effort is normal. No respiratory distress.  ?Skin: ?   General: Skin is warm and dry.  ?Neurological:  ?   Mental Status: She is alert and oriented to person, place, and time.  ?Psychiatric:     ?   Mood and Affect: Mood normal.     ?   Behavior: Behavior normal.  ? ?Lab Results:  ?Recent Labs  ?  08/12/21 ?0642  ?WBC 10.0  ?HGB 13.6  ?HCT 41.5  ?PLT 302  ? ?BMET ?Recent Labs  ?  08/12/21 ?1635 08/13/21 ?7412  ?NA 128* 134*  ?K 4.1 3.9  ?CL 94* 100  ?CO2 26 27  ?GLUCOSE 119* 115*  ?BUN 9 11  ?CREATININE 0.69 0.72  ?CALCIUM 8.8* 9.4  ? ?PT/INR ?Recent Labs  ?  08/12/21 ?0642  ?LABPROT 13.0  ?INR 1.0  ? ?Assessment & Plan: ?81 year old female admitted with acute urinary retention and severe constipation following right ureteral stent removal after undergoing right ureteroscopy at OSH. ? ?She still has not had a bowel movement and Foley catheter remains in place.  We originally planned for voiding trial today, however I recommend deferring this pending continued bowel regimen and proceeding with voiding trial once she starts moving her bowels, as this is likely contributory.  She is in agreement with this plan. ? ?No evidence of urinary infection at this point and she is feeling better.  We continue to not recommend more aggressive intervention for her right hydronephrosis and suspect this will resolve on its own. ? ?Recommendations: ?-Continue bowel regimen to stimulate bowel movement ?-Defer voiding trial until after she is able to move her  bowels ? ?Carman Ching, PA-C ?08/13/2021  ?

## 2021-08-13 NOTE — Progress Notes (Signed)
Mobility Specialist - Progress Note ? ? 08/13/21 1155  ?Mobility  ?Activity Ambulated with assistance to bathroom  ?Level of Assistance Standby assist, set-up cues, supervision of patient - no hands on  ?Assistive Device None  ?Distance Ambulated (ft) 30 ft  ?Activity Response Tolerated well  ?$Mobility charge 1 Mobility  ? ? ? ?Pt standing at bedside upon arrival, requesting assistance to bathroom. Large, loose BM---pt able to perform standing peri-care with supervision. Floors cleaned d/t stool on floor. Pt ambulated to sink for hand hygiene then back to EOB where she was left with needs in reach, son at bedside.  ? ? ?Filiberto Pinks ?Mobility Specialist ?08/13/21, 11:58 AM ? ? ? ? ?

## 2021-08-13 NOTE — TOC Initial Note (Signed)
Transition of Care (TOC) - Initial/Assessment Note  ? ? ?Patient Details  ?Name: Anne Crosby ?MRN: [id number] ?Date of Birth: 02-15-1941 ? ?Transition of Care (TOC) CM/SW Contact:    ?Chapman Fitch, RN ?Phone Number: ?08/13/2021, 12:41 PM ? ?Clinical Narrative:                 ? ? ?Transition of Care (TOC) Screening Note ? ? ?Patient Details  ?Name: Anne Crosby ?Date of Birth: 05-02-1940 ? ? ?Transition of Care (TOC) CM/SW Contact:    ?Chapman Fitch, RN ?Phone Number: ?08/13/2021, 12:41 PM ? ? ? ?Transition of Care Department [facility]) has reviewed patient and no TOC needs have been identified at this time. We will continue to monitor patient advancement through interdisciplinary progression rounds. If new patient transition needs arise, please place a TOC consult. ? ?Spoke with patient and son.  Patient declines home health services at discharge.  Patient drives at baseline and has RW in the home ?  ?  ? ? ?Patient Goals and CMS Choice ?  ?  ?  ? ?Expected Discharge Plan and Services ?  ?  ?  ?  ?  ?                ?  ?  ?  ?  ?  ?  ?  ?  ?  ?  ? ?Prior Living Arrangements/Services ?  ?  ?  ?       ?  ?  ?  ?  ? ?Activities of Daily Living ?Home Assistive Devices/Equipment: None ?ADL Screening (condition at time of admission) ?Patient's cognitive ability adequate to safely complete daily activities?: Yes ?Is the patient deaf or have difficulty hearing?: Yes ?Does the patient have difficulty seeing, even when wearing glasses/contacts?: Yes ?Does the patient have difficulty concentrating, remembering, or making decisions?: No ?Patient able to express need for assistance with ADLs?: Yes ?Does the patient have difficulty dressing or bathing?: No ?Independently performs ADLs?: Yes (appropriate for developmental age) ?Does the patient have difficulty walking or climbing stairs?: No ?Weakness of Legs: None ?Weakness of Arms/Hands: None ? ?Permission Sought/Granted ?  ?  ?   ?   ?   ?   ? ?Emotional  Assessment ?  ?  ?  ?  ?  ?  ? ?Admission diagnosis:  Hiatal hernia [K44.9] ?Hypokalemia [E87.6] ?Urinary retention [R33.9] ?Diverticulosis [K57.90] ?Hyponatremia [E87.1] ?Aortic atherosclerosis (HCC) [I70.0] ?Hydronephrosis, right [N13.30] ?Hydronephrosis, unspecified hydronephrosis type [N13.30] ?Patient Active Problem List  ? Diagnosis Date Noted  ? Hydronephrosis, right 08/12/2021  ? Hypertension 08/12/2021  ? Hyperlipemia 08/12/2021  ? Acute urinary retention 08/12/2021  ? Hyponatremia 08/12/2021  ? Diabetes mellitus without complication (HCC) 08/12/2021  ? GERD (gastroesophageal reflux disease) 08/12/2021  ? Constipation 08/12/2021  ? Hypokalemia   ? ?[facility] ?Pharmacy:  No Pharmacies Listed ? ? ? ?Social Determinants of Health (SDOH) Interventions ?  ? ?Readmission Risk Interventions ?   ? View : No data to display.  ?  ?  ?  ? ? ? ?

## 2021-08-13 NOTE — Care Management CC44 (Addendum)
Condition Code 44 Documentation Completed ? ?Patient Details  ?Name: Anne Crosby ?MRN: [id number] ?Date of Birth: 23-Oct-1940 ? ? ?Condition Code 44 given:  Yes ?Patient signature on Condition Code 44 notice:  Yes ?Documentation of 2 MD's agreement:  Yes ?Code 44 added to claim:  Yes ? ?RNCM covering virtually, spoke with patient and her son Erskine Speed. Will mail code 94 document. ? ?Roseanne Kaufman, RN ?08/13/2021, 5:25 PM ? ?

## 2021-08-13 NOTE — Progress Notes (Signed)
Foley catheter removed for voiding trial late this morning.  Patient has been unable to void and denies the urge to do so.  Bladder scan shows 819 mL. ? ?Recommend Foley catheter replacement given failed inpatient voiding trial.  Patient is clinically stable and okay for discharge with Foley catheter in place.  I spoke with her and her son this afternoon and explained that she will require an outpatient voiding trial in approximately 1 week.  Counseled her to reach out to [facility] tomorrow to arrange this.  If they cannot see her in time, okay to schedule with [facility] urology instead. ? ?Orders placed to replace Foley catheter.  I asked nursing to instruct her in catheter care and provide her with a leg bag.  Recommend discontinuing oxybutynin, as this is likely contributory to her constipation and retention. ? ?Carman Ching, PA-C ?08/13/21 ?4:46 PM ? ?

## 2021-08-13 NOTE — Plan of Care (Signed)
  Problem: Elimination: Goal: Will not experience complications related to urinary retention Outcome: Not Progressing   Problem: Safety: Goal: Ability to remain free from injury will improve Outcome: Not Progressing   

## 2021-08-13 NOTE — Discharge Instructions (Addendum)
Please call [facility] tomorrow morning and tell them that you developed postoperative urinary retention and were admitted at [facility]. A Foley catheter was replaced on 4/20 after you failed an inpatient voiding trial. You need to arrange a voiding trial in approximately 1 week. If they cannot see you in time, you may call [facility] at [phone] and we will see you instead. ?

## 2021-08-13 NOTE — Discharge Summary (Signed)
Physician Discharge Summary  ?Anne Crosby XLK:[id number] DOB: 1941/02/03 DOA: 08/12/2021 ? ?PCP: [facility] ? ?Admit date: 08/12/2021 ?Discharge date: 08/13/2021 ? ?Admitted From: Home ?Disposition:  Home ? ?Recommendations for Outpatient Follow-up:  ?Follow up with PCP in 1-2 weeks ?Follow up with urology ([facility] or [facility]) in 1 week for voiding trial ? ?Home Health:No  ?Equipment/Devices:Foley 16Fr with leg bag  ? ?Discharge Condition:Stable  ?CODE STATUS:FULL  ?Diet recommendation: Reg ? ?Brief/Interim Summary: ?81 y.o. female with medical history significant of hypertension, hyperlipidemia, diabetes mellitus, GERD, kidney stone, who presents with bladder spasm and right flank pain ?  ?Due to mild right hydronephrosis with an obstructing 15 mm stone in the distal right ureter, pt underwent recent cystoscopy ureteroscopy with stent placement on the right on 4/14 at [facility], having had the stent removed yesterday.  ?  ?Pt states that started feeling bladder spasm, burning on urination since yesterday, no dysuria or hematuria. She has right sided flank pan and back pain. She states she has indigestion and acid reflux symptoms. Patient has nausea and vomited little bit with nonbilious nonbloody vomiting.  No abdominal pain.  Denies chest pain, cough, shortness of breath with no fever. Pt has chills and shaking. She is anxious. States that she is constipated. She took some Dulcolax and used 2 suppositories but only had a small amount of stool throughout this morning. Pt was found to have acute urinary retention, Foley catheter is placed in ED. ? ?Seen by urology.  Felt to be secondary to constipation.  Bowel regimen initiated, with success.  Unfortunately patient failed voiding trial.  D/w urology.  Recommend replacing foley catheter with plan for outpatient voiding trial in 1 week.  Patient can either go to [facility] or [facility] if unable to get in at [facility].  At time of dc will stop ditropan, continue flomax.   Patient dc with Foley catheter in place. ? ? ? ?Discharge Diagnoses:  ?Principal Problem: ?  Hydronephrosis, right ?Active Problems: ?  Hypertension ?  Hyperlipemia ?  Acute urinary retention ?  Hyponatremia ?  Hypokalemia ?  Diabetes mellitus without complication (HCC) ?  GERD (gastroesophageal reflux disease) ?  Constipation ?* Hydronephrosis, right ?Pt underwent recent cystoscopy ureteroscopy with stent placement on the right on 4/14 at [facility], having had the stent removed yesterday. No has bladder spasm and acute urinary retention.  Urinalysis is positive for nitrite otherwise not impressive. Per Dr. Apolinar Junes of urology, nitrite is positive because of Pyridium and pt dose not have urine infection. CT scan showed moderate right hydronephrosis, but no new stone. Possibly due to sequela of a recently passed stone versus urinary tract infection.  Dr. Apolinar Junes of urology is consulted. ?  ?Hydro felt to be secondary to procedure.  Foley placed.  Patient failed voiding trial.  Retained 800+ in bladder.  Foley replaced prior to dc.  Stop ditropan.  DC home with outpatient voiding trial in 1 week.  No need for abx on dc. ? ? ?Discharge Instructions ? ?Discharge Instructions   ? ? Diet - low sodium heart healthy   Complete by: As directed ?  ? Increase activity slowly   Complete by: As directed ?  ? ?  ? ?Allergies as of 08/13/2021   ? ?   Reactions  ? Ciprofloxacin   ? Other reaction(s): Other (See Comments) ?tendonitis  ? Diclofenac Diarrhea  ? Doxycycline   ? Other reaction(s): Other (See Comments)  ? Elemental Sulfur Hives  ? Sulfa Antibiotics  Rash  ? ?  ? ?  ?Medication List  ?  ? ?STOP taking these medications   ? ?alendronate 70 MG tablet ?Commonly known as: FOSAMAX ?  ? ?  ? ?TAKE these medications   ? ?amLODipine 10 MG tablet ?Commonly known as: NORVASC ?Take 10 mg by mouth daily. ?  ?aspirin 81 MG tablet ?Take 81 mg by mouth daily. ?  ?atorvastatin 20 MG tablet ?Commonly known as: LIPITOR ?Take 20 mg by mouth  daily. ?  ?bifidobacterium infantis capsule ?Take 1 capsule by mouth daily. ?  ?CALCIUM CITRATE CHEWY BITE PO ?Take 1 tablet by mouth daily. ?  ?losartan 50 MG tablet ?Commonly known as: COZAAR ?Take 50 mg by mouth daily. ?  ?metFORMIN 500 MG 24 hr tablet ?Commonly known as: GLUCOPHAGE-XR ?TAKE 1 TABLET BY MOUTH EVERY DAY WITH DINNER ?  ?MULTIVITAMIN ADULT PO ?Take 1 tablet by mouth daily. ?  ?pantoprazole 40 MG tablet ?Commonly known as: PROTONIX ?Take 40 mg by mouth daily. ?  ?polyethylene glycol 17 g packet ?Commonly known as: MIRALAX / GLYCOLAX ?Take 17 g by mouth daily as needed. ?  ?potassium bicarbonate 25 MEQ disintegrating tablet ?Commonly known as: K-LYTE ?Take 1 tablet by mouth daily. ?  ?tamsulosin 0.4 MG Caps capsule ?Commonly known as: FLOMAX ?Take 0.4 mg by mouth daily. ?  ?Vitamin D 50 MCG (2000 UT) Caps ?Take 2,000 Units by mouth daily. ?  ? ?  ? ? ?Allergies  ?Allergen Reactions  ? Ciprofloxacin   ?  Other reaction(s): Other (See Comments) ?tendonitis  ? Diclofenac Diarrhea  ? Doxycycline   ?  Other reaction(s): Other (See Comments)  ? Elemental Sulfur Hives  ? Sulfa Antibiotics Rash  ? ? ?Consultations: ?Urology ? ? ?Procedures/Studies: ?DG Abd 1 View ? ?Result Date: 08/13/2021 ?CLINICAL DATA:  Hydronephrosis, constipation. EXAM: ABDOMEN - 1 VIEW COMPARISON:  October 29, 2019, CT of the abdomen and pelvis from April of 2023. FINDINGS: Increasing gastric distension since the recent CT evaluation. Gas throughout the colon without substantial distension. No signs of small bowel obstruction. Small amount of stool and gas in the rectum. On limited assessment no acute skeletal findings with substantial degenerative change in the lumbar spine in levoconvex curvature of the lumbar spine. Post RIGHT hip arthroplasty. EKG leads project over the abdomen. IMPRESSION: 1. No sign of bowel obstruction with increasing gastric distension since previous imaging of uncertain significance. Electronically Signed   By:  Donzetta Kohut M.D.   On: 08/13/2021 08:54  ? ?CT ABDOMEN PELVIS W CONTRAST ? ?Result Date: 08/12/2021 ?CLINICAL DATA:  Abdominal pain, acute, nonlocalized EXAM: CT ABDOMEN AND PELVIS WITH CONTRAST TECHNIQUE: Multidetector CT imaging of the abdomen and pelvis was performed using the standard protocol following bolus administration of intravenous contrast. RADIATION DOSE REDUCTION: This exam was performed according to the departmental dose-optimization program which includes automated exposure control, adjustment of the mA and/or kV according to patient size and/or use of iterative reconstruction technique. CONTRAST:  91mL OMNIPAQUE IOHEXOL 300 MG/ML  SOLN COMPARISON:  None. FINDINGS: Lower chest: Minor atelectasis.  Heart size is normal. Hepatobiliary: No focal liver abnormality is seen. No gallstones, gallbladder wall thickening, or biliary dilatation. Pancreas: Unremarkable. No pancreatic ductal dilatation or surrounding inflammatory changes. Spleen: Normal in size without focal abnormality. Adrenals/Urinary Tract: Unremarkable adrenal glands. Moderate right hydronephrosis. Urothelial enhancement of the right renal collecting system and throughout the length of the right ureter. No right-sided renal or ureteral calculi are identified. Numerous pelvic phleboliths slightly limited  ability to detect small distal ureteral calculi. There are numerous punctate nonobstructing stones within the upper pole of the left kidney. No left-sided hydronephrosis. No left ureteral calculi. Marked distention of the urinary bladder to the of the umbilicus. Small cystocele. Bladder is low lying within the pelvis compatible with a degree of pelvic floor laxity. Stomach/Bowel: Small-moderate-sized hiatal hernia. Stomach appears otherwise within normal limits. From no dilated loops of bowel. Numerous diverticula throughout the colon. No focal bowel wall thickening or inflammatory changes. A normal appendix is seen in the right lower  quadrant. Vascular/Lymphatic: Scattered aortoiliac atherosclerotic calcifications without aneurysm. No abdominopelvic lymphadenopathy. Reproductive: Status post hysterectomy. No adnexal masses. Other: No free fluid.

## 2021-08-13 NOTE — Care Management (Signed)
Patient admitted for hydronephrosis and bladder spasm.  Recent urological procedure.  Urology engaged for consultation.  Right hydronephrosis felt to be related to recent procedure is well as constipation.  Patient 6 days without BM.  Aggressive bowel regimen initiated.  Patient had multiple BMs noted on 4/20.  Discussed with urology.  Will attempt voiding trial today.  DC Foley catheter.  Bladder scan.  Possible discharge later this afternoon versus tomorrow. ? ? ?

## 2021-08-13 NOTE — Progress Notes (Signed)
Mobility Specialist - Progress Note ? ? ? 08/13/21 1400  ?Mobility  ?Activity Ambulated with assistance in hallway  ?Level of Assistance Standby assist, set-up cues, supervision of patient - no hands on  ?Assistive Device None  ?Distance Ambulated (ft) 320 ft  ?Activity Response Tolerated well  ?$Mobility charge 1 Mobility  ? ? ?Pre-mobility: 97 HR, 96% SpO2 ?During mobility: 107 HR, 94% SpO2 ? ?Pt completes standing at door upon arrival using RA ambulating indep. Encourages pt to ambulate in hallway and completes 2 laps with supervision --- 2 mild LOB noted but self corrected. Voiced no complaints throughout. Pt returned EOB with needs in reach.  ? ?Clarisa Schools ?Mobility Specialist ?08/13/21, 2:38 PM ? ? ? ? ?

## 2021-09-25 ENCOUNTER — Emergency Department
Admission: EM | Admit: 2021-09-25 | Discharge: 2021-09-25 | Disposition: A | Discharge status: Home / Self Care | Payer: Medicare Other | Attending: Emergency Medicine | Admitting: Emergency Medicine

## 2021-09-25 DIAGNOSIS — R102 Pelvic and perineal pain: Secondary | ICD-10-CM | POA: Diagnosis not present

## 2021-09-25 DIAGNOSIS — T839XXA Unspecified complication of genitourinary prosthetic device, implant and graft, initial encounter: Secondary | ICD-10-CM | POA: Insufficient documentation

## 2021-09-25 MED ORDER — LIDOCAINE HCL URETHRAL/MUCOSAL 2 % EX GEL
1.0000 "application " | Freq: Once | CUTANEOUS | Status: AC
  Administered 2021-09-25: 1
  Filled 2021-09-25: qty 6

## 2021-09-25 NOTE — ED Triage Notes (Signed)
Pt to ED via POV from home. Pt seen at [facility] urologist today due to prolapse and had a pessary placed. Pt also has catheter in place due to urinary issues. Pt stating she had to leave quickly during appointment and states the pessary is painful and wants it out. Pt states [facility] office is now closed and cannot go back until Monday.   Pt denies any other complaints.

## 2021-09-25 NOTE — ED Notes (Signed)
Pt was bladder scanned by this tech and 0 mls of urine were found.

## 2021-09-25 NOTE — ED Provider Notes (Signed)
[facility] Emergency Department Provider Note     Event Date/Time   First MD Initiated Contact with Patient 09/25/21 1659     (approximate)   History   Vaginal Pain   HPI  Anne Crosby is a 81 y.o. female with a history of urinary retention and bladder prolapse, presents to the ED from her urologist office.  Patient was seen earlier today, and had a gelhorn pessary placed.  She left the office in a hurry, following the appointment.  She presents now several hours later, noting some pain in discomfort with the pessary.  She is requesting to have the pessary removed.  She will be able to follow-up with the office on Monday for reevaluation.  Patient has a Foley catheter in place for retention.  No other complaints at this time.     Physical Exam   Triage Vital Signs: ED Triage Vitals  Enc Vitals Group     BP 09/25/21 1649 139/68     Pulse Rate 09/25/21 1649 (!) 102     Resp 09/25/21 1649 18     Temp 09/25/21 1649 98.1 F (36.7 C)     Temp Source 09/25/21 1649 Oral     SpO2 09/25/21 1649 95 %     Weight --      Height --      Head Circumference --      Peak Flow --      Pain Score 09/25/21 1647 5     Pain Loc --      Pain Edu? --      Excl. in GC? --     Most recent vital signs: Vitals:   09/25/21 1809 09/25/21 1810  BP: 127/71   Pulse:  89  Resp:    Temp:    SpO2:  98%    General Awake, no distress.  CV:  Good peripheral perfusion.  RESP:  Normal effort.  ABD:  No distention.  GU:  Normal external genitalia.  Foley catheter noted in the urethra.  Gellhorn pessary noted at the vaginal introitus.   ED Results / Procedures / Treatments   Labs (all labs ordered are listed, but only abnormal results are displayed) Labs Reviewed - No data to display   EKG    RADIOLOGY   No results found.   PROCEDURES:  Critical Care performed: No  .Foreign Body Removal  Date/Time: 09/25/2021 7:19 PM Performed by: Lissa Hoard, PA-C Authorized by: Lissa Hoard, PA-C  Consent: Verbal consent obtained. Written consent not obtained. Risks and benefits: risks, benefits and alternatives were discussed Consent given by: patient Patient understanding: patient states understanding of the procedure being performed Patient identity confirmed: verbally with patient Body area: vagina  Sedation: Patient sedated: no  Patient restrained: no Patient cooperative: yes Localization method: visualized Removal mechanism: ring forceps Complexity: simple 1 objects recovered. Objects recovered: Gelhorn pessary Post-procedure assessment: foreign body removed Patient tolerance: patient tolerated the procedure well with no immediate complications Comments: Lidocaine gel placed at vaginal introitus post-procedure at patient request.    MEDICATIONS ORDERED IN ED: Medications  lidocaine (XYLOCAINE) 2 % jelly 1 application. (1 application. Other Given by Other 09/25/21 1916)     IMPRESSION / MDM / ASSESSMENT AND PLAN / ED COURSE  I reviewed the triage vital signs and the nursing notes.  Differential diagnosis includes, but is not limited to, pessary, vaginal foreign body, Foley catheter  Patient's presentation is most consistent with acute, uncomplicated illness.  Geriatric patient to the ED for evaluation of pessary irritation.  She had pessary placed today, and was unable to return to her office to have it removed.  Patient presents in no acute distress but she also has history of chronic urinary retention, has a chronic indwelling Foley catheter.  Patient consents to attempted removal in the ED.  That attempt was successful and the patient was discharged in no acute distress to follow with her urologist. Patient is given ED precautions to return to the ED for any worsening or new symptoms.     FINAL CLINICAL IMPRESSION(S) / ED DIAGNOSES   Final diagnoses:  Problem with  vaginal pessary, initial encounter [facility])     Rx / DC Orders   ED Discharge Orders     None        Note:  This document was prepared using Dragon voice recognition software and may include unintentional dictation errors.    Lissa Hoard, PA-C 09/25/21 1934    Delton [place], MD 10/04/21 [phone]

## 2021-09-25 NOTE — ED Notes (Signed)
Son is pt's ride home.

## 2021-09-25 NOTE — Discharge Instructions (Addendum)
We have successfully removed your pessary.  You should follow-up with your gynecologist for further evaluation and management of urinary retention and bladder prolapse.

## 2021-09-25 NOTE — ED Notes (Addendum)
See triage note. Pt sitting calmly on stretcher, skin dry, resp reg/unlabored; pt in NAD. Pt denies any issues with her catheter. Pt reports sharp intermittent pain in her vagina triggered by movement.

## 2021-09-25 NOTE — ED Notes (Signed)
Lido jelly to provider JM's desk.

## 2021-10-13 ENCOUNTER — Ambulatory Visit
Admission: EM | Admit: 2021-10-13 | Discharge: 2021-10-13 | Disposition: A | Discharge status: Home / Self Care | Payer: Medicare Other | Attending: Physician Assistant | Admitting: Physician Assistant

## 2021-10-13 DIAGNOSIS — N3000 Acute cystitis without hematuria: Secondary | ICD-10-CM | POA: Diagnosis present

## 2021-10-13 DIAGNOSIS — R300 Dysuria: Secondary | ICD-10-CM

## 2021-10-13 LAB — URINALYSIS, ROUTINE W REFLEX MICROSCOPIC
Bilirubin Urine: NEGATIVE
Glucose, UA: NEGATIVE mg/dL
Hgb urine dipstick: NEGATIVE
Ketones, ur: NEGATIVE mg/dL
Nitrite: POSITIVE — AB
Protein, ur: NEGATIVE mg/dL
Specific Gravity, Urine: <1.005 — ABNORMAL LOW (ref 1.005–1.030)
pH: 6 (ref 5.0–8.0)

## 2021-10-13 LAB — URINALYSIS, MICROSCOPIC (REFLEX)

## 2021-10-13 MED ORDER — CEPHALEXIN 500 MG PO CAPS: 500.0000 mg | ORAL_CAPSULE | Freq: Two times a day (BID) | ORAL | 0 refills | Status: AC

## 2021-10-13 NOTE — ED Provider Notes (Signed)
[facility]    CSN: [id number] Arrival date & time: 10/13/21  1136      History   Chief Complaint Chief Complaint  Patient presents with   Dysuria    HPI Anne Crosby is a 81 y.o. female presenting for 4-day history of dysuria, foul-smelling urine, urinary frequency and a couple episodes of urinary incontinence.  Patient reports that for the past 2 months she has had a Foley catheter in place due to urinary retention.  States she just got it taken out the same day her symptoms began.  She denies any associated fever, abdominal pain or flank pain.  No reports of any blood in the urine, chills, nausea or vomiting.  Patient follows up with urogynecology through [facility] for vaginal prolapse, urinary retention.  Patient also has a history of kidney stones, diabetes, hyperlipidemia.  No other complaints.  HPI  Past Medical History:  Diagnosis Date   Hyperlipemia    Hypertension    Kidney stones     Patient Active Problem List   Diagnosis Date Noted   Urinary retention 08/13/2021   Hydronephrosis, right 08/12/2021   Hypertension 08/12/2021   Hyperlipemia 08/12/2021   Acute urinary retention 08/12/2021   Hyponatremia 08/12/2021   Diabetes mellitus without complication (HCC) 08/12/2021   GERD (gastroesophageal reflux disease) 08/12/2021   Constipation 08/12/2021   Hypokalemia     Past Surgical History:  Procedure Laterality Date   ABDOMINAL HYSTERECTOMY     CATARACT EXTRACTION     COLON SURGERY     kidney stone removal      OB History   No obstetric history on file.      Home Medications    Prior to Admission medications   Medication Sig Start Date End Date Taking? Authorizing Provider  cephALEXin (KEFLEX) 500 MG capsule Take 1 capsule (500 mg total) by mouth 2 (two) times daily for 10 days. 10/13/21 10/23/21 Yes Eusebio Friendly B, PA-C  amLODipine (NORVASC) 10 MG tablet Take 10 mg by mouth daily.    [provider]  aspirin 81 MG tablet Take  81 mg by mouth daily.    [provider]  atorvastatin (LIPITOR) 20 MG tablet Take 20 mg by mouth daily.    [provider]  bifidobacterium infantis (ALIGN) capsule Take 1 capsule by mouth daily. 05/09/19   [provider]  Calcium Citrate-Vitamin D (CALCIUM CITRATE CHEWY BITE PO) Take 1 tablet by mouth daily.    [provider]  Cholecalciferol (VITAMIN D) 50 MCG (2000 UT) CAPS Take 2,000 Units by mouth daily. 05/19/09   [provider]  losartan (COZAAR) 50 MG tablet Take 50 mg by mouth daily.    [provider]  metFORMIN (GLUCOPHAGE-XR) 500 MG 24 hr tablet TAKE 1 TABLET BY MOUTH EVERY DAY WITH DINNER 03/11/21   [provider]  Multiple Vitamin (MULTIVITAMIN ADULT PO) Take 1 tablet by mouth daily. 08/10/06   [provider]  pantoprazole (PROTONIX) 40 MG tablet Take 40 mg by mouth daily. 01/09/21   [provider]  polyethylene glycol (MIRALAX / GLYCOLAX) 17 g packet Take 17 g by mouth daily as needed. 08/13/21   [name], MD  potassium bicarbonate (K-LYTE) 25 MEQ disintegrating tablet Take 1 tablet by mouth daily. 06/02/21 06/02/22  [provider]  tamsulosin (FLOMAX) 0.4 MG CAPS capsule Take 0.4 mg by mouth daily. 08/07/21   [provider]    Family History Family History  Problem Relation Age of  Onset   Breast cancer Mother 35   Cancer Mother    Alzheimer's disease Mother    Stroke Mother    Heart disease Father    Stroke Father     Social History Social History   Tobacco Use   Smoking status: Never   Smokeless tobacco: Never  Substance Use Topics   Alcohol use: No   Drug use: No     Allergies   Ciprofloxacin, Diclofenac, Doxycycline, Elemental sulfur, and Sulfa antibiotics   Review of Systems Review of Systems  Constitutional:  Negative for chills, fatigue and fever.  Gastrointestinal:  Negative for abdominal pain, diarrhea, nausea and vomiting.  Genitourinary:   Positive for dysuria, frequency and urgency. Negative for decreased urine volume, flank pain, hematuria, pelvic pain, vaginal bleeding, vaginal discharge and vaginal pain.  Musculoskeletal:  Negative for back pain.  Skin:  Negative for rash.     Physical Exam Triage Vital Signs ED Triage Vitals  Enc Vitals Group     BP 10/13/21 1247 140/69     Pulse Rate 10/13/21 1247 94     Resp 10/13/21 1247 16     Temp 10/13/21 1247 98.3 F (36.8 C)     Temp Source 10/13/21 1247 Oral     SpO2 10/13/21 1247 100 %     Weight --      Height --      Head Circumference --      Peak Flow --      Pain Score 10/13/21 1246 0     Pain Loc --      Pain Edu? --      Excl. in GC? --    No data found.  Updated Vital Signs BP 140/69 (BP Location: Right Arm)   Pulse 94   Temp 98.3 F (36.8 C) (Oral)   Resp 16   SpO2 100%   Physical Exam Vitals and nursing note reviewed.  Constitutional:      General: She is not in acute distress.    Appearance: Normal appearance. She is not ill-appearing or toxic-appearing.  HENT:     Head: Normocephalic and atraumatic.  Eyes:     General: No scleral icterus.       Right eye: No discharge.        Left eye: No discharge.     Conjunctiva/sclera: Conjunctivae normal.  Cardiovascular:     Rate and Rhythm: Normal rate and regular rhythm.     Heart sounds: Normal heart sounds.  Pulmonary:     Effort: Pulmonary effort is normal. No respiratory distress.     Breath sounds: Normal breath sounds.  Abdominal:     Palpations: Abdomen is soft.     Tenderness: There is abdominal tenderness (suprapubic). There is no right CVA tenderness or left CVA tenderness.  Musculoskeletal:     Cervical back: Neck supple.  Skin:    General: Skin is dry.  Neurological:     General: No focal deficit present.     Mental Status: She is alert. Mental status is at baseline.     Motor: No weakness.     Gait: Gait normal.  Psychiatric:        Mood and Affect: Mood normal.         Behavior: Behavior normal.        Thought Content: Thought content normal.      UC Treatments / Results  Labs (all labs ordered are listed, but only abnormal results are displayed) Labs Reviewed  URINALYSIS, ROUTINE W REFLEX MICROSCOPIC - Abnormal; Notable for the following components:      Result Value   APPearance HAZY (*)    Specific Gravity, Urine <1.005 (*)    Nitrite POSITIVE (*)    Leukocytes,Ua SMALL (*)    All other components within normal limits  URINALYSIS, MICROSCOPIC (REFLEX) - Abnormal; Notable for the following components:   Bacteria, UA MANY (*)    All other components within normal limits  URINE CULTURE    EKG   Radiology No results found.  Procedures Procedures (including critical care time)  Medications Ordered in UC Medications - No data to display  Initial Impression / Assessment and Plan / UC Course  I have reviewed the triage vital signs and the nursing notes.  Pertinent labs & imaging results that were available during my care of the patient were reviewed by me and considered in my medical decision making (see chart for details).  81 year old female presenting for 4-day history of dysuria, urinary odor, frequency and a couple episodes of urinary incontinence.  She recently had a Foley catheter which was in place for the past 2 months.  History of vaginal prolapse, renal stones and frequent UTIs.  Her last UTI was a couple of years ago when she received cephalosporins and she has allergy to sulfa and fluoroquinolones.  Vitals are normal and stable today and she is overall well-appearing.  Abdomen is soft and she has mild suprapubic tenderness.  No CVA tenderness.  Urinalysis today shows positive nitrites and small leukocytes as well as many bacteria.  We will send urine for culture and treat at this time with Keflex.  Encouraged staying hydrated it looks like she has been drinking excessive amount of water.  Advised plenty of rest and follow-up with  urogynecology.  ER precautions reviewed.   Final Clinical Impressions(s) / UC Diagnoses   Final diagnoses:  Acute cystitis without hematuria  Dysuria     Discharge Instructions      UTI: Based on either symptoms or urinalysis, you may have a urinary tract infection. We will send the urine for culture and call with results in a few days. Begin antibiotics at this time. Your symptoms should be much improved over the next 2-3 days. Increase rest and fluid intake. If for some reason symptoms are worsening or not improving after a couple of days or the urine culture determines the antibiotics you are taking will not treat the infection, the antibiotics may be changed. Return or go to ER for fever, back pain, worsening urinary pain, discharge, increased blood in urine. May take Tylenol or Motrin OTC for pain relief or consider AZO if no contraindications   Inform your urogynecology provider that you were seen today and have a UTI.  We are sending your urine for culture and we will call if we need to change antibiotics.  Stay hydrated and rested.  Go to emergency department if you develop a fever, flank pain or worsening symptoms.  You should be improving over the next 2 days.     ED Prescriptions     Medication Sig Dispense Auth. Provider   cephALEXin (KEFLEX) 500 MG capsule Take 1 capsule (500 mg total) by mouth 2 (two) times daily for 10 days. 20 capsule Shirlee Latch, PA-C      PDMP not reviewed this encounter.   Shirlee Latch, PA-C 10/13/21 1400

## 2021-10-13 NOTE — ED Triage Notes (Signed)
Patient presents to Urgent Care with complaints of dysuria and odor since Friday. Pt was seen at urology clinic and foley cath was removed. Treating with cystex with some relief.   Denies fever.

## 2021-10-13 NOTE — Discharge Instructions (Addendum)
UTI: Based on either symptoms or urinalysis, you may have a urinary tract infection. We will send the urine for culture and call with results in a few days. Begin antibiotics at this time. Your symptoms should be much improved over the next 2-3 days. Increase rest and fluid intake. If for some reason symptoms are worsening or not improving after a couple of days or the urine culture determines the antibiotics you are taking will not treat the infection, the antibiotics may be changed. Return or go to ER for fever, back pain, worsening urinary pain, discharge, increased blood in urine. May take Tylenol or Motrin OTC for pain relief or consider AZO if no contraindications   Inform your urogynecology provider that you were seen today and have a UTI.  We are sending your urine for culture and we will call if we need to change antibiotics.  Stay hydrated and rested.  Go to emergency department if you develop a fever, flank pain or worsening symptoms.  You should be improving over the next 2 days.

## 2021-10-15 LAB — URINE CULTURE: Culture: >=100000 — AB

## 2021-10-16 ENCOUNTER — Telehealth (HOSPITAL_COMMUNITY): Payer: Self-pay | Admitting: Emergency Medicine

## 2021-10-16 MED ORDER — NITROFURANTOIN MONOHYD MACRO 100 MG PO CAPS: 100.0000 mg | ORAL_CAPSULE | Freq: Two times a day (BID) | ORAL | 0 refills | Status: DC

## 2021-10-22 ENCOUNTER — Other Ambulatory Visit: Payer: Self-pay

## 2021-10-22 ENCOUNTER — Encounter: Payer: Self-pay | Admitting: Emergency Medicine

## 2021-10-22 ENCOUNTER — Ambulatory Visit
Admission: EM | Admit: 2021-10-22 | Discharge: 2021-10-22 | Disposition: A | Discharge status: Home / Self Care | Payer: Medicare Other

## 2021-10-22 ENCOUNTER — Emergency Department: Payer: Medicare Other

## 2021-10-22 ENCOUNTER — Emergency Department
Admission: EM | Admit: 2021-10-22 | Discharge: 2021-10-22 | Disposition: A | Discharge status: Home / Self Care | Payer: Medicare Other | Attending: Emergency Medicine | Admitting: Emergency Medicine

## 2021-10-22 DIAGNOSIS — Z87442 Personal history of urinary calculi: Secondary | ICD-10-CM | POA: Insufficient documentation

## 2021-10-22 DIAGNOSIS — M1611 Unilateral primary osteoarthritis, right hip: Secondary | ICD-10-CM | POA: Insufficient documentation

## 2021-10-22 DIAGNOSIS — N3289 Other specified disorders of bladder: Secondary | ICD-10-CM | POA: Diagnosis not present

## 2021-10-22 DIAGNOSIS — R300 Dysuria: Secondary | ICD-10-CM | POA: Insufficient documentation

## 2021-10-22 DIAGNOSIS — M25551 Pain in right hip: Secondary | ICD-10-CM | POA: Diagnosis present

## 2021-10-22 DIAGNOSIS — N390 Urinary tract infection, site not specified: Secondary | ICD-10-CM

## 2021-10-22 DIAGNOSIS — I10 Essential (primary) hypertension: Secondary | ICD-10-CM | POA: Insufficient documentation

## 2021-10-22 LAB — URINALYSIS, ROUTINE W REFLEX MICROSCOPIC
Bacteria, UA: NONE SEEN
Bilirubin Urine: NEGATIVE
Glucose, UA: NEGATIVE mg/dL
Hgb urine dipstick: NEGATIVE
Ketones, ur: NEGATIVE mg/dL
Leukocytes,Ua: NEGATIVE
Nitrite: POSITIVE — AB
Protein, ur: NEGATIVE mg/dL
Specific Gravity, Urine: 1.008 (ref 1.005–1.030)
pH: 6 (ref 5.0–8.0)

## 2021-10-22 LAB — CBC
HCT: 43.4 % (ref 36.0–46.0)
Hemoglobin: 13.9 g/dL (ref 12.0–15.0)
MCH: 29.6 pg (ref 26.0–34.0)
MCHC: 32 g/dL (ref 30.0–36.0)
MCV: 92.3 fL (ref 80.0–100.0)
Platelets: 249 10*3/uL (ref 150–400)
RBC: 4.7 MIL/uL (ref 3.87–5.11)
RDW: 14.1 % (ref 11.5–15.5)
WBC: 7.8 10*3/uL (ref 4.0–10.5)
nRBC: 0 % (ref 0.0–0.2)

## 2021-10-22 LAB — COMPREHENSIVE METABOLIC PANEL
ALT: 18 U/L (ref 0–44)
AST: 19 U/L (ref 15–41)
Albumin: 4.7 g/dL (ref 3.5–5.0)
Alkaline Phosphatase: 52 U/L (ref 38–126)
Anion gap: 9 (ref 5–15)
BUN: 15 mg/dL (ref 8–23)
CO2: 24 mmol/L (ref 22–32)
Calcium: 9.9 mg/dL (ref 8.9–10.3)
Chloride: 97 mmol/L — ABNORMAL LOW (ref 98–111)
Creatinine, Ser: 0.67 mg/dL (ref 0.44–1.00)
GFR, Estimated: >60 mL/min (ref >60)
Glucose, Bld: 148 mg/dL — ABNORMAL HIGH (ref 70–99)
Potassium: 4.5 mmol/L (ref 3.5–5.1)
Sodium: 130 mmol/L — ABNORMAL LOW (ref 135–145)
Total Bilirubin: 0.6 mg/dL (ref 0.3–1.2)
Total Protein: 7.6 g/dL (ref 6.5–8.1)

## 2021-10-22 MED ORDER — SODIUM CHLORIDE 0.9 % IV BOLUS
500.0000 mL | Freq: Once | INTRAVENOUS | Status: AC
  Administered 2021-10-22: 500 mL via INTRAVENOUS

## 2021-10-22 MED ORDER — TRAMADOL HCL 50 MG PO TABS: 50.0000 mg | ORAL_TABLET | Freq: Four times a day (QID) | ORAL | 0 refills | Status: DC | PRN

## 2021-10-22 NOTE — ED Notes (Signed)
Patient is being discharged from the Urgent Care and sent to the Emergency Department via POV or patient's specialist . Per Nehemiah Settle, PA, patient is in need of higher level of care due to persistent UTI symptoms and allergies. Patient is aware and verbalizes understanding of plan of care.  Vitals:   10/22/21 0827  BP: 123/73  Pulse: 95  Resp: 18  Temp: 98.4 F (36.9 C)  SpO2: 96%

## 2021-10-22 NOTE — ED Provider Notes (Signed)
Patient returning to [facility] urgent care for continued UTI symptoms.  Patient seen by me on 10/13/2021 for complicated UTI related to recent Foley catheter.  Foley catheter was removed around the time symptoms started.  Patient does follow-up with urogynecology.  She was placed on Keflex by me.  Culture result came back with evidence to suggest resistance to this medication so she was switched to Macrobid on 10/16/2021.  Patient has allergies to sulfa and Cipro.  Those are the only 3 medications that show sensitivity to the E. coli seen on the culture.  I reiterated this to patient.  Advised contacting her urogynecologist for immediate appointment or going to emergency department as there are no other oral medications we can give that show sensitivity to the bacteria causing her complicated UTI.  Patient says she is still needing to take the AZO for her symptoms.  Continues to have burning with urination, frequency and right flank pain.  No fevers and overall well-appearing in clinic today.  Patient understands that if she is not treated for this complicated UTI it could worsen and she could get much sicker.  She plans to call urogynecology when she gets home and go from there.   Shirlee Latch, PA-C 10/22/21 [phone]

## 2021-10-22 NOTE — ED Notes (Signed)
Pt states she was told to come here for IV antibiotics. States same dysuria, and frequent urination as when previously started on antibiotics.

## 2021-10-22 NOTE — ED Notes (Signed)
Patient states instructions back to clinical staff just as presented to her.  Questions answered.

## 2021-10-22 NOTE — ED Triage Notes (Signed)
Patient took her last antibiotic tablet yesterday for uti.  Was seen 10/13/2021.  Patient still has burning, irritation.  History of uti.  Patient complaining of right hip/flank pain

## 2021-10-22 NOTE — ED Provider Notes (Signed)
[facility] Provider Note    Event Date/Time   First MD Initiated Contact with Patient 10/22/21 1155     (approximate)   History   Dysuria and Urinary Frequency   HPI  Anne Crosby is a 81 y.o. female presents to the emergency department for treatment and evaluation of bladder discomfort and right hip pain.  She has been taking antibiotics for acute cystitis after having had her catheter removed on 10/09/21.  She was diagnosed with acute cystitis 4 days later.  She has completed her Macrobid but continues to have pain in the area of her bladder.  She had presented to urgent care today secondary to continued UTI symptoms.  Culture submitted previously shows that she has a complicated UTI only sensitive to sulfa, Macrobid, and ciprofloxacin.  She is allergic to sulfa and ciprofloxacin and was therefore sent to the emergency department for evaluation.  In addition to the symptoms, patient states that she has been having severe pain in her posterior right hip that is not associated with any recent injury.  She states that pain is focal and does not radiate.  She associates this with right hip arthritis but states that it has never been this bad before.  Typically she can sit on a heating pad for a bit in the mornings and the symptoms are relieved but since yesterday pain has not responded as usual.  Some relief with Tylenol.  Past Medical History:  Diagnosis Date   Hyperlipemia    Hypertension    Kidney stones      Physical Exam   Triage Vital Signs: ED Triage Vitals  Enc Vitals Group     BP 10/22/21 1108 (!) 152/69     Pulse Rate 10/22/21 1108 88     Resp 10/22/21 1108 16     Temp 10/22/21 1108 98.2 F (36.8 C)     Temp Source 10/22/21 1108 Oral     SpO2 10/22/21 1108 94 %     Weight 10/22/21 1120 145 lb 1 oz (65.8 kg)     Height 10/22/21 1120 5\' 3"  (1.6 m)     Head Circumference --      Peak Flow --      Pain Score 10/22/21 1120 0     Pain Loc  --      Pain Edu? --      Excl. in GC? --     Most recent vital signs: Vitals:   10/22/21 1108 10/22/21 1452  BP: (!) 152/69 140/88  Pulse: 88 90  Resp: 16 18  Temp: 98.2 F (36.8 C)   SpO2: 94% 92%    General: Awake, no distress.  CV:  Good peripheral perfusion.  Resp:  Normal effort.  Abd:  No distention.  Other:  Tenderness over the suprapubic area.   Nonfocal tenderness over the posterior aspect of the right hip.  Some pain with flexion of the right knee.   ED Results / Procedures / Treatments   Labs (all labs ordered are listed, but only abnormal results are displayed) Labs Reviewed  COMPREHENSIVE METABOLIC PANEL - Abnormal; Notable for the following components:      Result Value   Sodium 130 (*)    Chloride 97 (*)    Glucose, Bld 148 (*)    All other components within normal limits  URINALYSIS, ROUTINE W REFLEX MICROSCOPIC - Abnormal; Notable for the following components:   Color, Urine AMBER (*)    APPearance CLEAR (*)  Nitrite POSITIVE (*)    All other components within normal limits  URINE CULTURE  CBC     EKG  Not indicated   RADIOLOGY  Image of the right hip shows osteoarthritis but is otherwise negative for acute concerns.  I have independently reviewed and interpreted imaging as well as reviewed report from radiology.  PROCEDURES:  Critical Care performed: No  Procedures   MEDICATIONS ORDERED IN ED:  Medications  sodium chloride 0.9 % bolus 500 mL (0 mLs Intravenous Stopped 10/22/21 1454)     IMPRESSION / MDM / ASSESSMENT AND PLAN / ED COURSE   I reviewed the triage vital signs and the nursing notes.  Differential diagnosis includes, but is not limited to: Acute cystitis, bladder spasm.  Hip osteoarthritis, stress fracture right hip, sciatica  Patient's presentation is most consistent with acute complicated illness / injury requiring diagnostic workup.  81 year old female presenting to the emergency department for treatment  and evaluation of pain in the bladder area.  See HPI for further details.  Urinalysis is positive for nitrates but is otherwise negative.  The patient has been taking Azo which is likely the cause of the nitrate reading.  Image of the right hip negative for acute concerns.  She will be referred to orthopedics for further evaluation.  She is requesting steroid injection which she has had in the past that worked well.      FINAL CLINICAL IMPRESSION(S) / ED DIAGNOSES   Final diagnoses:  Osteoarthritis of right hip, unspecified osteoarthritis type  Bladder irritation     Rx / DC Orders   ED Discharge Orders          Ordered    traMADol (ULTRAM) 50 MG tablet  Every 6 hours PRN        10/22/21 1506             Note:  This document was prepared using Dragon voice recognition software and may include unintentional dictation errors.   Chinita Pester, FNP 10/22/21 Dereck Ligas, MD 10/23/21 [phone]

## 2021-10-22 NOTE — ED Triage Notes (Signed)
Pt reports was advised to come to the ED for IV antibiotics to treat her complicated UTI. Pt has taken macrobid with no relief.

## 2021-10-22 NOTE — ED Notes (Signed)
Brooke, PA  at bedside to speak to patient and provide instructions

## 2021-10-22 NOTE — Discharge Instructions (Addendum)
-  Your urine culture shows that 3 medications will work and these include Macrobid which you have already taken, Cipro which you are allergic to, and Bactrim which you are also allergic to. Next line treatment would be IV medications.  I am unsure if they are able to do this in your urogynecology clinic.  If not you will need to proceed to the emergency department.  You have a very complicated UTI which is resistant to a lot of different medications unfortunately.

## 2021-10-22 NOTE — Discharge Instructions (Addendum)
Your hip x-ray does not show any new findings of concern.  I have submitted pain medication to [facility] in [place].  Please call and schedule follow-up appointment with orthopedics.  I have also sent your urine for culture.  If there are is any bacterial growth you will be notified and a prescription for an antibiotic will be sent to your pharmacy.  Please follow-up with your urogynecologist if your symptoms are not improving or change.  Return to the emergency department for symptoms of concern if you are unable to schedule an appointment.

## 2021-10-24 ENCOUNTER — Emergency Department
Admission: EM | Admit: 2021-10-24 | Discharge: 2021-10-24 | Disposition: A | Discharge status: Home / Self Care | Payer: Medicare Other | Attending: Emergency Medicine | Admitting: Emergency Medicine

## 2021-10-24 ENCOUNTER — Encounter: Payer: Self-pay | Admitting: Emergency Medicine

## 2021-10-24 ENCOUNTER — Other Ambulatory Visit: Payer: Self-pay

## 2021-10-24 DIAGNOSIS — M25551 Pain in right hip: Secondary | ICD-10-CM | POA: Insufficient documentation

## 2021-10-24 DIAGNOSIS — E119 Type 2 diabetes mellitus without complications: Secondary | ICD-10-CM | POA: Insufficient documentation

## 2021-10-24 DIAGNOSIS — I10 Essential (primary) hypertension: Secondary | ICD-10-CM | POA: Diagnosis not present

## 2021-10-24 DIAGNOSIS — Z760 Encounter for issue of repeat prescription: Secondary | ICD-10-CM | POA: Diagnosis not present

## 2021-10-24 DIAGNOSIS — Z96641 Presence of right artificial hip joint: Secondary | ICD-10-CM | POA: Insufficient documentation

## 2021-10-24 LAB — URINE CULTURE

## 2021-10-24 MED ORDER — HYDROCODONE-ACETAMINOPHEN 5-325 MG PO TABS: 1.0000 | ORAL_TABLET | ORAL | 0 refills | Status: DC | PRN

## 2021-10-24 NOTE — ED Provider Notes (Addendum)
[facility] Provider Note    Event Date/Time   First MD Initiated Contact with Patient 10/24/21 [phone]     (approximate)   History   Hip Pain and Medication Refill   HPI  Anne Crosby is a 81 y.o. female with a history of HTN, hyperlipidemia, diabetes, GERD presents to the ER today with complaint of persistent right hip pain.  She reports this started 4 days ago.  She describes the pain as sharp and stabbing.  The pain does not radiate.  She denies loss of bowel or bladder control.  She denies numbness, tingling or weakness of her right lower extremity.  She has had a total hip replacement in 2018/2019 and has not had any issues since that time.  She denies any recent injury to the area or fall.  She was seen in the ER 6/29 for the same.  X-ray at that time showed no acute findings without any issues with the hardware.  She was given a prescription for Tramadol which she reports has been completely ineffective.  She has a follow-up with Dr. Joice Lofts 7/11 for the same and reports she needs something to get her through until then.  She reports she is unable to sleep secondary to the pain.      Physical Exam   Triage Vital Signs: ED Triage Vitals  Enc Vitals Group     BP 10/24/21 0753 130/65     Pulse Rate 10/24/21 0753 95     Resp 10/24/21 0753 18     Temp 10/24/21 0753 97.7 F (36.5 C)     Temp Source 10/24/21 0753 Oral     SpO2 10/24/21 0753 95 %     Weight 10/24/21 0747 143 lb 4.8 oz (65 kg)     Height 10/24/21 0747 5\' 3"  (1.6 m)     Head Circumference --      Peak Flow --      Pain Score 10/24/21 0747 10     Pain Loc --      Pain Edu? --      Excl. in GC? --     Most recent vital signs: Vitals:   10/24/21 0753  BP: 130/65  Pulse: 95  Resp: 18  Temp: 97.7 F (36.5 C)  SpO2: 95%    General: Awake, no distress.  CV:  RRR, no murmur noted.  Pedal pulse 2+ on the right. Resp:  Normal effort.  CTA bilaterally. MSK:  Normal abduction,  abduction and external rotation of the right hip.  Decreased internal rotation of the right hip with pain.  No pain with palpation of the right hip.  Strength 5/5 BLE.  Gait slow and steady without device.   ED Results / Procedures / Treatments     MEDICATIONS ORDERED IN ED: Medications - No data to display   IMPRESSION / MDM / ASSESSMENT AND PLAN / ED COURSE  I reviewed the triage vital signs and the nursing notes.  Right Hip Pain:  Differential diagnosis includes, but is not limited to, osteoarthritis of the right hip, periprosthetic hip fracture, sciatica  Patient's presentation is most consistent with acute, uncomplicated illness.  I offered her a CT scan for further evaluation given that her pain is persistent.  Discussed that not all fractures are seen on x-ray however she is insistent that the x-ray was fine and she would prefer to follow-up with orthopedics as an outpatient Also discussed that this could be coming from a nerve  impingement in the back, offered x-ray of the lumbar spine which she also declines at this time Rx for Hydrocodone 5-325 mg every 8 hours PO given.  She is very concerned that she has been run out of medication prior to her orthopedic appointment and is asking that I can give her enough until that time.  Advised her that I can only give her a 5-day supply of medication and she will need to see her PCP for further refills until her orthopedic appointment   FINAL CLINICAL IMPRESSION(S) / ED DIAGNOSES   Final diagnoses:  Right hip pain     Rx / DC Orders   ED Discharge Orders          Ordered    HYDROcodone-acetaminophen (NORCO/VICODIN) 5-325 MG tablet  Every 4 hours PRN        10/24/21 0836             Note:  This document was prepared using Dragon voice recognition software and may include unintentional dictation errors.    Lorre Munroe, NP 10/24/21 0837    Lorre Munroe, NP 10/24/21 6734    Merwyn Katos, MD 10/24/21  1003

## 2021-10-24 NOTE — ED Triage Notes (Signed)
Pt reports was seen here recently for arthritis pain and given a rx for pain and told to follow up. Pt reports she made her appt with Dr. Joice Lofts but it is not until 7/11 and the pain medication given to her here is not strong enough. Pt states she has been taking it as prescribed but needs something stronger to get her until her appt

## 2021-10-24 NOTE — ED Notes (Signed)
See triage note.

## 2021-10-24 NOTE — Discharge Instructions (Signed)
You were seen today for hip pain.  Your tramadol was ineffective so we will discontinue this.  I have given you prescription for hydrocodone to take every 8 hours as needed.  You may also take ibuprofen 600 mg every 8 hours and use Voltaren gel OTC as needed for discomfort.  I would recommend you would try ice instead of heat.  Please follow-up with orthopedics as previously scheduled.

## 2021-11-25 ENCOUNTER — Other Ambulatory Visit: Payer: Self-pay | Admitting: Orthopedic Surgery

## 2021-11-25 DIAGNOSIS — M4807 Spinal stenosis, lumbosacral region: Secondary | ICD-10-CM

## 2021-12-07 ENCOUNTER — Ambulatory Visit
Admission: RE | Admit: 2021-12-07 | Discharge: 2021-12-07 | Disposition: A | Discharge status: Home / Self Care | Payer: Medicare Other | Source: Ambulatory Visit | Attending: Orthopedic Surgery | Admitting: Orthopedic Surgery

## 2021-12-07 DIAGNOSIS — M4807 Spinal stenosis, lumbosacral region: Secondary | ICD-10-CM

## 2021-12-08 ENCOUNTER — Ambulatory Visit
Admission: RE | Admit: 2021-12-08 | Discharge: 2021-12-08 | Disposition: A | Discharge status: Home / Self Care | Payer: Medicare Other | Source: Ambulatory Visit | Attending: Orthopedic Surgery | Admitting: Orthopedic Surgery

## 2021-12-23 ENCOUNTER — Ambulatory Visit
Admission: EM | Admit: 2021-12-23 | Discharge: 2021-12-23 | Disposition: A | Discharge status: Home / Self Care | Payer: Medicare Other | Attending: Emergency Medicine | Admitting: Emergency Medicine

## 2021-12-23 ENCOUNTER — Other Ambulatory Visit: Payer: Self-pay

## 2021-12-23 DIAGNOSIS — Z20822 Contact with and (suspected) exposure to covid-19: Secondary | ICD-10-CM | POA: Diagnosis not present

## 2021-12-23 DIAGNOSIS — N390 Urinary tract infection, site not specified: Secondary | ICD-10-CM | POA: Diagnosis present

## 2021-12-23 LAB — URINALYSIS, MICROSCOPIC (REFLEX): WBC, UA: >50 WBC/hpf (ref 0–5)

## 2021-12-23 LAB — URINALYSIS, ROUTINE W REFLEX MICROSCOPIC
Bilirubin Urine: NEGATIVE
Glucose, UA: NEGATIVE mg/dL
Ketones, ur: NEGATIVE mg/dL
Nitrite: NEGATIVE
Protein, ur: 30 mg/dL — AB
Specific Gravity, Urine: 1.01 (ref 1.005–1.030)
pH: 6.5 (ref 5.0–8.0)

## 2021-12-23 LAB — SARS CORONAVIRUS 2 BY RT PCR: SARS Coronavirus 2 by RT PCR: NEGATIVE

## 2021-12-23 MED ORDER — CEPHALEXIN 500 MG PO CAPS: 500.0000 mg | ORAL_CAPSULE | Freq: Two times a day (BID) | ORAL | 0 refills | Status: AC

## 2021-12-23 MED ORDER — PHENAZOPYRIDINE HCL 200 MG PO TABS: 200.0000 mg | ORAL_TABLET | Freq: Three times a day (TID) | ORAL | 0 refills | Status: AC

## 2021-12-23 NOTE — Discharge Instructions (Addendum)
Take the Keflex twice daily for 7 days with food for treatment of urinary tract infection.  Use the Pyridium every 8 hours as needed for urinary discomfort.  This will turn your urine a bright red-orange.  Increase your oral fluid intake so that you increase your urine production and or flushing your urinary system.  Take an over-the-counter probiotic, such as Culturelle-Align-Activia, 1 hour after each dose of antibiotic to prevent diarrhea or yeast infections from forming.  We will culture urine and change the antibiotics if necessary.  Return for reevaluation, or see your primary care provider, for any new or worsening symptoms.  

## 2021-12-23 NOTE — ED Provider Notes (Signed)
[facility]    CSN: [id number] Arrival date & time: 12/23/21  1603      History   Chief Complaint Chief Complaint  Patient presents with   Dysuria   Chills   Urinary Frequency   Tachycardia    HPI Anne Crosby is a 81 y.o. female.   HPI  81 year old female here for evaluation of urinary complaints.  Patient reports that she has been experiencing urinary frequency, urgency, burning with urination, strong odor to her urine, and chills over the last several weeks.  In the last month she has had 2 episodes of urinary incontinence.  She denies any measured fever, blood in her urine, nausea, low back pain, or belly pain.  Patient is requesting to be tested for COVID.  Past Medical History:  Diagnosis Date   Hyperlipemia    Hypertension    Kidney stones     Patient Active Problem List   Diagnosis Date Noted   Urinary retention 08/13/2021   Hydronephrosis, right 08/12/2021   Hypertension 08/12/2021   Hyperlipemia 08/12/2021   Acute urinary retention 08/12/2021   Hyponatremia 08/12/2021   Diabetes mellitus without complication (HCC) 08/12/2021   GERD (gastroesophageal reflux disease) 08/12/2021   Constipation 08/12/2021   Hypokalemia     Past Surgical History:  Procedure Laterality Date   ABDOMINAL HYSTERECTOMY     CATARACT EXTRACTION     COLON SURGERY     kidney stone removal      OB History   No obstetric history on file.      Home Medications    Prior to Admission medications   Medication Sig Start Date End Date Taking? Authorizing Provider  cephALEXin (KEFLEX) 500 MG capsule Take 1 capsule (500 mg total) by mouth 2 (two) times daily for 7 days. 12/23/21 12/30/21 Yes Becky Augusta, NP  estradiol (ESTRACE) 0.1 MG/GM vaginal cream Place vaginally. 10/12/21  Yes [provider]  phenazopyridine (PYRIDIUM) 200 MG tablet Take 1 tablet (200 mg total) by mouth 3 (three) times daily. 12/23/21  Yes Becky Augusta, NP  amLODipine (NORVASC) 10  MG tablet Take 10 mg by mouth daily.    [provider]  aspirin 81 MG tablet Take 81 mg by mouth daily.    [provider]  atorvastatin (LIPITOR) 20 MG tablet Take 20 mg by mouth daily.    [provider]  bifidobacterium infantis (ALIGN) capsule Take 1 capsule by mouth daily. 05/09/19   [provider]  Calcium Citrate-Vitamin D (CALCIUM CITRATE CHEWY BITE PO) Take 1 tablet by mouth daily.    [provider]  Cholecalciferol (VITAMIN D) 50 MCG (2000 UT) CAPS Take 2,000 Units by mouth daily. 05/19/09   [provider]  gabapentin (NEURONTIN) 300 MG capsule Take 300 mg by mouth 3 (three) times daily. 12/12/21   [provider]  losartan (COZAAR) 50 MG tablet Take 50 mg by mouth daily.    [provider]  metFORMIN (GLUCOPHAGE-XR) 500 MG 24 hr tablet TAKE 1 TABLET BY MOUTH EVERY DAY WITH DINNER 03/11/21   [provider]  Multiple Vitamin (MULTIVITAMIN ADULT PO) Take 1 tablet by mouth daily. 08/10/06   [provider]  pantoprazole (PROTONIX) 40 MG tablet Take 40 mg by mouth daily. 01/09/21   [provider]  polyethylene glycol (MIRALAX / GLYCOLAX) 17 g packet Take 17 g by mouth daily as needed. 08/13/21   Tresa Moore, MD  potassium bicarbonate (K-LYTE) 25 MEQ disintegrating tablet Take 1 tablet  by mouth daily. 06/02/21 06/02/22  [provider]    Family History Family History  Problem Relation Age of Onset   Breast cancer Mother 73   Cancer Mother    Alzheimer's disease Mother    Stroke Mother    Heart disease Father    Stroke Father     Social History Social History   Tobacco Use   Smoking status: Never   Smokeless tobacco: Never  Substance Use Topics   Alcohol use: No   Drug use: No     Allergies   Ciprofloxacin, Diclofenac, Doxycycline, Elemental sulfur, and Sulfa antibiotics   Review of Systems Review of Systems  Constitutional:  Positive for chills.   Gastrointestinal:  Negative for abdominal pain and nausea.  Genitourinary:  Positive for dysuria, frequency and urgency. Negative for hematuria.  Musculoskeletal:  Negative for back pain.  Hematological: Negative.   Psychiatric/Behavioral: Negative.       Physical Exam Triage Vital Signs ED Triage Vitals  Enc Vitals Group     BP 12/23/21 1614 (!) 148/75     Pulse Rate 12/23/21 1614 (!) 120     Resp 12/23/21 1614 17     Temp 12/23/21 1614 98.7 F (37.1 C)     Temp Source 12/23/21 1614 Oral     SpO2 12/23/21 1614 97 %     Weight 12/23/21 1616 135 lb (61.2 kg)     Height 12/23/21 1616 5\' 2"  (1.575 m)     Head Circumference --      Peak Flow --      Pain Score 12/23/21 1615 3     Pain Loc --      Pain Edu? --      Excl. in GC? --    No data found.  Updated Vital Signs BP (!) 148/75 (BP Location: Right Arm)   Pulse (!) 114   Temp 98.7 F (37.1 C) (Oral)   Resp 17   Ht 5\' 2"  (1.575 m)   Wt 135 lb (61.2 kg)   SpO2 97%   BMI 24.69 kg/m   Visual Acuity Right Eye Distance:   Left Eye Distance:   Bilateral Distance:    Right Eye Near:   Left Eye Near:    Bilateral Near:     Physical Exam Vitals and nursing note reviewed.  Constitutional:      Appearance: Normal appearance. She is not ill-appearing.  HENT:     Head: Normocephalic and atraumatic.  Cardiovascular:     Rate and Rhythm: Normal rate and regular rhythm.     Pulses: Normal pulses.     Heart sounds: Normal heart sounds. No murmur heard.    No gallop.  Pulmonary:     Effort: Pulmonary effort is normal.     Breath sounds: Normal breath sounds. No wheezing, rhonchi or rales.  Abdominal:     General: Abdomen is flat.     Palpations: Abdomen is soft.     Tenderness: There is no abdominal tenderness. There is no right CVA tenderness or left CVA tenderness.  Skin:    General: Skin is warm and dry.     Capillary Refill: Capillary refill takes less than 2 seconds.     Findings: No erythema or rash.   Neurological:     General: No focal deficit present.     Mental Status: She is alert and oriented to person, place, and time.  Psychiatric:        Mood and Affect: Mood normal.  Behavior: Behavior normal.        Thought Content: Thought content normal.        Judgment: Judgment normal.      UC Treatments / Results  Labs (all labs ordered are listed, but only abnormal results are displayed) Labs Reviewed  URINALYSIS, ROUTINE W REFLEX MICROSCOPIC - Abnormal; Notable for the following components:      Result Value   Color, Urine STRAW (*)    APPearance HAZY (*)    Hgb urine dipstick MODERATE (*)    Protein, ur 30 (*)    Leukocytes,Ua MODERATE (*)    All other components within normal limits  URINALYSIS, MICROSCOPIC (REFLEX) - Abnormal; Notable for the following components:   Bacteria, UA MANY (*)    All other components within normal limits  SARS CORONAVIRUS 2 BY RT PCR  URINE CULTURE    EKG   Radiology No results found.  Procedures Procedures (including critical care time)  Medications Ordered in UC Medications - No data to display  Initial Impression / Assessment and Plan / UC Course  I have reviewed the triage vital signs and the nursing notes.  Pertinent labs & imaging results that were available during my care of the patient were reviewed by me and considered in my medical decision making (see chart for details).   Patient is a nontoxic-appearing 81 year old female here for evaluation of dysuria, urgency, and frequency that have been going on for approximate the past 2 weeks.  Also in the past months she has had 2 episodes of incontinence of urine.  She has been experiencing some chills and a strong odor to her urine as well.  She denies any fever, abdominal pain, nausea, low back pain, or hematuria.  On exam patient is a benign cardiopulmonary exam revealing S1-S2 heart sounds with regular rate and rhythm and lung sounds that are clear to auscultation all  fields.  No CVA tenderness on exam.  Abdomen is soft and nontender.  Patient is consuming water but she has been unable to provide a urine specimen at present.  She has a family at the bedside.  Patient also is requesting a COVID test.  Patient is tachycardic at 114 but she is afebrile here.  She denies any upper or lower respiratory symptoms.  COVID PCR was collected at triage and is pending.  COVID PCR is negative.  Urinalysis is straw in color with a hazy appearance and moderate hemoglobin.  30 protein, moderate leukocyte esterase.  Negative for nitrates or ketones.  Reflex microscopy shows greater than 50 WBCs, many bacteria, and WBC clumps.  I will send urine for culture.   Final Clinical Impressions(s) / UC Diagnoses   Final diagnoses:  Lower urinary tract infectious disease     Discharge Instructions      Take the Keflex twice daily for 7 days with food for treatment of urinary tract infection.  Use the Pyridium every 8 hours as needed for urinary discomfort.  This will turn your urine a bright red-orange.  Increase your oral fluid intake so that you increase your urine production and or flushing your urinary system.  Take an over-the-counter probiotic, such as Culturelle-Align-Activia, 1 hour after each dose of antibiotic to prevent diarrhea or yeast infections from forming.  We will culture urine and change the antibiotics if necessary.  Return for reevaluation, or see your primary care provider, for any new or worsening symptoms.      ED Prescriptions     Medication Sig  Dispense Auth. Provider   cephALEXin (KEFLEX) 500 MG capsule Take 1 capsule (500 mg total) by mouth 2 (two) times daily for 7 days. 14 capsule Becky Augusta, NP   phenazopyridine (PYRIDIUM) 200 MG tablet Take 1 tablet (200 mg total) by mouth 3 (three) times daily. 6 tablet Becky Augusta, NP      PDMP not reviewed this encounter.   Becky Augusta, NP 12/23/21 1735

## 2021-12-23 NOTE — ED Triage Notes (Addendum)
Pt reports urinary frequency, slight discomfort, strong odor and chills. Sx started a few days ago. Pt would also like a COVID test due to recent fever

## 2021-12-27 LAB — URINE CULTURE: Culture: >=100000 — AB

## 2021-12-28 ENCOUNTER — Telehealth (HOSPITAL_COMMUNITY): Payer: Self-pay | Admitting: Emergency Medicine

## 2021-12-28 MED ORDER — NITROFURANTOIN MONOHYD MACRO 100 MG PO CAPS: 100.0000 mg | ORAL_CAPSULE | Freq: Two times a day (BID) | ORAL | 0 refills | Status: DC

## 2022-03-14 ENCOUNTER — Ambulatory Visit
Admission: EM | Admit: 2022-03-14 | Discharge: 2022-03-14 | Disposition: A | Discharge status: Home / Self Care | Payer: Medicare Other | Attending: Emergency Medicine | Admitting: Emergency Medicine

## 2022-03-14 DIAGNOSIS — R319 Hematuria, unspecified: Secondary | ICD-10-CM | POA: Diagnosis not present

## 2022-03-14 DIAGNOSIS — I10 Essential (primary) hypertension: Secondary | ICD-10-CM | POA: Insufficient documentation

## 2022-03-14 DIAGNOSIS — N390 Urinary tract infection, site not specified: Secondary | ICD-10-CM | POA: Insufficient documentation

## 2022-03-14 LAB — URINALYSIS, ROUTINE W REFLEX MICROSCOPIC
Bilirubin Urine: NEGATIVE
Glucose, UA: NEGATIVE mg/dL
Ketones, ur: NEGATIVE mg/dL
Nitrite: POSITIVE — AB
Protein, ur: NEGATIVE mg/dL
Specific Gravity, Urine: <1.005 — ABNORMAL LOW (ref 1.005–1.030)
pH: 6 (ref 5.0–8.0)

## 2022-03-14 LAB — URINALYSIS, MICROSCOPIC (REFLEX)

## 2022-03-14 MED ORDER — PHENAZOPYRIDINE HCL 200 MG PO TABS: 200.0000 mg | ORAL_TABLET | Freq: Three times a day (TID) | ORAL | 0 refills | Status: AC | PRN

## 2022-03-14 MED ORDER — NITROFURANTOIN MONOHYD MACRO 100 MG PO CAPS: 100.0000 mg | ORAL_CAPSULE | Freq: Two times a day (BID) | ORAL | 0 refills | Status: AC

## 2022-03-14 NOTE — ED Provider Notes (Signed)
HPI  SUBJECTIVE:  Anne Crosby is a 81 y.o. female who presents with dysuria, urgency, frequency starting last night.  No cloudy or odorous urine, hematuria, nausea, vomiting, fevers, abdominal, back, pelvic pain, vaginal odor, discharge or bleeding.  She is not sexually active.  No recent antibiotics.  She took ibuprofen within 6 hours of evaluation.  She has tried rest.  No aggravating or alleviating factors.  She states this feels identical to previous UTIs, which she gets frequently.  She also has a history of nephrolithiasis, TIA, bladder prolapse, diabetes, hypertension.  She states that she did not sleep last night , is nervous, and that her blood pressure at home is usually 135/80.  She is on daily methenamine as an antiseptic prescribed by urology.  Urine culture from August 23 grew out ESBL E. coli that was resistant to cephalosporins, Cipro, but susceptible to Macrobid and Bactrim.  She states that the 5-day course of Macrobid prescribed to her with a previous UTI was helping, but did not completely eradicate her symptoms.  She was switched to Keflex, and was then switched to another antibiotic by her PCP.  She does not remember the name of that antibiotic.  Past Medical History:  Diagnosis Date   Hyperlipemia    Hypertension    Kidney stones     Past Surgical History:  Procedure Laterality Date   ABDOMINAL HYSTERECTOMY     CATARACT EXTRACTION     COLON SURGERY     kidney stone removal      Family History  Problem Relation Age of Onset   Breast cancer Mother 57   Cancer Mother    Alzheimer's disease Mother    Stroke Mother    Heart disease Father    Stroke Father     Social History   Tobacco Use   Smoking status: Never   Smokeless tobacco: Never  Substance Use Topics   Alcohol use: No   Drug use: No    No current facility-administered medications for this encounter.  Current Outpatient Medications:    nitrofurantoin, macrocrystal-monohydrate,  (MACROBID) 100 MG capsule, Take 1 capsule (100 mg total) by mouth 2 (two) times daily for 7 days., Disp: 14 capsule, Rfl: 0   phenazopyridine (PYRIDIUM) 200 MG tablet, Take 1 tablet (200 mg total) by mouth 3 (three) times daily as needed for pain., Disp: 6 tablet, Rfl: 0   amLODipine (NORVASC) 10 MG tablet, Take 10 mg by mouth daily., Disp: , Rfl:    aspirin 81 MG tablet, Take 81 mg by mouth daily., Disp: , Rfl:    atorvastatin (LIPITOR) 20 MG tablet, Take 20 mg by mouth daily., Disp: , Rfl:    bifidobacterium infantis (ALIGN) capsule, Take 1 capsule by mouth daily., Disp: , Rfl:    Calcium Citrate-Vitamin D (CALCIUM CITRATE CHEWY BITE PO), Take 1 tablet by mouth daily., Disp: , Rfl:    Cholecalciferol (VITAMIN D) 50 MCG (2000 UT) CAPS, Take 2,000 Units by mouth daily., Disp: , Rfl:    estradiol (ESTRACE) 0.1 MG/GM vaginal cream, Place vaginally., Disp: , Rfl:    gabapentin (NEURONTIN) 300 MG capsule, Take 300 mg by mouth 3 (three) times daily., Disp: , Rfl:    losartan (COZAAR) 50 MG tablet, Take 50 mg by mouth daily., Disp: , Rfl:    metFORMIN (GLUCOPHAGE-XR) 500 MG 24 hr tablet, TAKE 1 TABLET BY MOUTH EVERY DAY WITH DINNER, Disp: , Rfl:    Multiple Vitamin (MULTIVITAMIN ADULT PO), Take 1 tablet by mouth  daily., Disp: , Rfl:    pantoprazole (PROTONIX) 40 MG tablet, Take 40 mg by mouth daily., Disp: , Rfl:    phenazopyridine (PYRIDIUM) 200 MG tablet, Take 1 tablet (200 mg total) by mouth 3 (three) times daily., Disp: 6 tablet, Rfl: 0   polyethylene glycol (MIRALAX / GLYCOLAX) 17 g packet, Take 17 g by mouth daily as needed., Disp: 14 each, Rfl: 0   potassium bicarbonate (K-LYTE) 25 MEQ disintegrating tablet, Take 1 tablet by mouth daily., Disp: , Rfl:   Allergies  Allergen Reactions   Ciprofloxacin     Other reaction(s): Other (See Comments) tendonitis   Diclofenac Diarrhea   Doxycycline     Other reaction(s): Other (See Comments)   Elemental Sulfur Hives   Sulfa Antibiotics Rash      ROS  As noted in HPI.   Physical Exam  BP (!) 178/77 (BP Location: Left Arm)   Pulse 100   Temp 97.8 F (36.6 C) (Oral)   Ht 5\' 2"  (1.575 m)   Wt 61.2 kg   SpO2 96%   BMI 24.68 kg/m   BP Readings from Last 3 Encounters:  03/14/22 (!) 178/77  12/23/21 (!) 148/75  10/24/21 130/65     Constitutional: Well developed, well nourished, no acute distress Eyes:  EOMI, conjunctiva normal bilaterally HENT: Normocephalic, atraumatic,mucus membranes moist Respiratory: Normal inspiratory effort Cardiovascular: Normal rate GI: nondistended no suprapubic, flank tenderness Back: No CVAT skin: No rash, skin intact Musculoskeletal: no deformities Neurologic: Alert & oriented x 3, no focal neuro deficits Psychiatric: Speech and behavior appropriate   ED Course   Medications - No data to display  Orders Placed This Encounter  Procedures   Urine Culture    Standing Status:   Standing    Number of Occurrences:   1    Order Specific Question:   Indication    Answer:   Dysuria   Urinalysis, Routine w reflex microscopic Urine, Clean Catch    Standing Status:   Standing    Number of Occurrences:   1   Urinalysis, Microscopic (reflex)    Standing Status:   Standing    Number of Occurrences:   1    No results found for this or any previous visit (from the past 24 hour(s)).  No results found.  ED Clinical Impression  1. Urinary tract infection with hematuria, site unspecified   2. Elevated blood pressure reading in office with diagnosis of hypertension      ED Assessment/Plan     Previous labs, outside records reviewed.  As noted in HPI.  Calculated creatinine clearance from July 23 64 mL/min.  1.  UTI.  UA consistent with a urinary tract infection with positive nitrites, leukocytes, many bacteria and pyuria.  Sending urine off for culture to confirm diagnosis and antibiotic choice. Will send home with Macrobid for a week since last time 5-day course did not  completely eradicate her symptoms.  Pyridium for symptoms.  Follow-up with PCP or gynecology as needed.  2.  Blood pressure noted.  Patient is otherwise asymptomatic. She states it is 135 /high 80s at home.  She will keep an eye on this.  Discussed labs, MDM, treatment plan, and plan for follow-up with patient. patient agrees with plan.   Meds ordered this encounter  Medications   phenazopyridine (PYRIDIUM) 200 MG tablet    Sig: Take 1 tablet (200 mg total) by mouth 3 (three) times daily as needed for pain.    Dispense:  6  tablet    Refill:  0   nitrofurantoin, macrocrystal-monohydrate, (MACROBID) 100 MG capsule    Sig: Take 1 capsule (100 mg total) by mouth 2 (two) times daily for 7 days.    Dispense:  14 capsule    Refill:  0      *This clinic note was created using Scientist, clinical (histocompatibility and immunogenetics). Therefore, there may be occasional mistakes despite careful proofreading.  ?    Domenick Gong, MD 03/16/22 [phone]

## 2022-03-14 NOTE — ED Triage Notes (Signed)
Patient reports that she is having burning with urination and urinary frequency -- Started yesterday.

## 2022-03-14 NOTE — Discharge Instructions (Signed)
Your last urine culture was sensitive to Macrobid, so we are going to try this first.  Pyridium will turn your urine orange, but will resolve your symptoms.  Make sure you drink plenty of extra fluids.  Continue the ibuprofen as needed.  Go to the ER for fevers above 100.4, vomiting, back pain, blood in your urine, or for other concerns.

## 2022-03-17 LAB — URINE CULTURE: Culture: >=100000 — AB

## 2022-08-10 ENCOUNTER — Ambulatory Visit
Admission: EM | Admit: 2022-08-10 | Discharge: 2022-08-10 | Disposition: A | Discharge status: Home / Self Care | Payer: Medicare Other | Attending: Physician Assistant | Admitting: Physician Assistant

## 2022-08-10 DIAGNOSIS — H8110 Benign paroxysmal vertigo, unspecified ear: Secondary | ICD-10-CM | POA: Diagnosis not present

## 2022-08-10 DIAGNOSIS — R030 Elevated blood-pressure reading, without diagnosis of hypertension: Secondary | ICD-10-CM

## 2022-08-10 MED ORDER — ONDANSETRON 4 MG PO TBDP: 2.0000 mg | ORAL_TABLET | Freq: Three times a day (TID) | ORAL | 0 refills | Status: AC | PRN

## 2022-08-10 NOTE — ED Provider Notes (Signed)
[facility]    CSN: [id number] Arrival date & time: 08/10/22  1148      History   Chief Complaint Chief Complaint  Patient presents with   Hypertension    HPI Anne Crosby is a 82 y.o. female.   82 year old female presents with elevated blood pressure, fatigue and dizziness.  Patient indicates for the past 2 days she has been having mild fatigue, nausea without vomiting, and intermittent dizziness.  Patient indicates that the dizziness is worse when she stands and make some motion or quick turns, symptoms are improved when she is sitting still.  Patient indicates she is also concerned about her blood pressure being elevated.  She indicates that her normal blood pressure usually ranges 135-140/72-76.  Patient indicates that she has not taken her blood pressure this morning and it was 168/over 80.  Patient is also concerned about her pulse being up from 80-86, to 106-110.  She indicates she is taking her blood pressure medicine Norvasc and losartan this.  She indicates she is not drinking fluids is much as she normally does.  She is without fever, shortness of breath, she does have a PCP that she sees on a regular basis.  She indicates she did have an episode of vertigo 20 years ago.  She is without headache, vision changes, upper or lower extremity numbness or tingling.   Hypertension    Past Medical History:  Diagnosis Date   Hyperlipemia    Hypertension    Kidney stones     Patient Active Problem List   Diagnosis Date Noted   Urinary retention 08/13/2021   Hydronephrosis, right 08/12/2021   Hypertension 08/12/2021   Hyperlipemia 08/12/2021   Acute urinary retention 08/12/2021   Hyponatremia 08/12/2021   Diabetes mellitus without complication 08/12/2021   GERD (gastroesophageal reflux disease) 08/12/2021   Constipation 08/12/2021   Hypokalemia     Past Surgical History:  Procedure Laterality Date   ABDOMINAL HYSTERECTOMY     CATARACT EXTRACTION      COLON SURGERY     kidney stone removal      OB History   No obstetric history on file.      Home Medications    Prior to Admission medications   Medication Sig Start Date End Date Taking? Authorizing Provider  ondansetron (ZOFRAN-ODT) 4 MG disintegrating tablet Take 0.5 tablets (2 mg total) by mouth every 8 (eight) hours as needed for nausea or vomiting. 08/10/22  Yes Ellsworth Lennox, PA-C  amLODipine (NORVASC) 10 MG tablet Take 10 mg by mouth daily.    [provider]  aspirin 81 MG tablet Take 81 mg by mouth daily.    [provider]  atorvastatin (LIPITOR) 20 MG tablet Take 20 mg by mouth daily.    [provider]  bifidobacterium infantis (ALIGN) capsule Take 1 capsule by mouth daily. 05/09/19   [provider]  Calcium Citrate-Vitamin D (CALCIUM CITRATE CHEWY BITE PO) Take 1 tablet by mouth daily.    [provider]  Cholecalciferol (VITAMIN D) 50 MCG (2000 UT) CAPS Take 2,000 Units by mouth daily. 05/19/09   [provider]  estradiol (ESTRACE) 0.1 MG/GM vaginal cream Place vaginally. 10/12/21   [provider]  gabapentin (NEURONTIN) 300 MG capsule Take 300 mg by mouth 3 (three) times daily. 12/12/21   [provider]  losartan (COZAAR) 50 MG tablet Take 50 mg by mouth daily.    [provider]  metFORMIN (GLUCOPHAGE-XR) 500 MG 24 hr tablet  TAKE 1 TABLET BY MOUTH EVERY DAY WITH DINNER 03/11/21   [provider]  Multiple Vitamin (MULTIVITAMIN ADULT PO) Take 1 tablet by mouth daily. 08/10/06   [provider]  pantoprazole (PROTONIX) 40 MG tablet Take 40 mg by mouth daily. 01/09/21   [provider]  phenazopyridine (PYRIDIUM) 200 MG tablet Take 1 tablet (200 mg total) by mouth 3 (three) times daily. 12/23/21   Becky Augusta, NP  phenazopyridine (PYRIDIUM) 200 MG tablet Take 1 tablet (200 mg total) by mouth 3 (three) times daily as needed for pain. 03/14/22   Domenick Gong, MD   polyethylene glycol (MIRALAX / GLYCOLAX) 17 g packet Take 17 g by mouth daily as needed. 08/13/21   [name], MD  potassium bicarbonate (K-LYTE) 25 MEQ disintegrating tablet Take 1 tablet by mouth daily. 06/02/21 06/02/22  [provider]    Family History Family History  Problem Relation Age of Onset   Breast cancer Mother 17   Cancer Mother    Alzheimer's disease Mother    Stroke Mother    Heart disease Father    Stroke Father     Social History Social History   Tobacco Use   Smoking status: Never   Smokeless tobacco: Never  Substance Use Topics   Alcohol use: No   Drug use: No     Allergies   Ciprofloxacin, Diclofenac, Doxycycline, Elemental sulfur, and Sulfa antibiotics   Review of Systems Review of Systems  Gastrointestinal:  Positive for nausea.  Neurological:  Positive for dizziness.     Physical Exam Triage Vital Signs ED Triage Vitals  Enc Vitals Group     BP 08/10/22 1158 (!) 168/82     Pulse Rate 08/10/22 1158 (!) 117     Resp 08/10/22 1158 18     Temp 08/10/22 1158 97.8 F (36.6 C)     Temp Source 08/10/22 1158 Oral     SpO2 08/10/22 1158 92 %     Weight --      Height --      Head Circumference --      Peak Flow --      Pain Score 08/10/22 1200 0     Pain Loc --      Pain Edu? --      Excl. in GC? --    No data found.  Updated Vital Signs BP (!) 168/82 (BP Location: Left Arm)   Pulse (!) 117   Temp 97.8 F (36.6 C) (Oral)   Resp 18   SpO2 92%   Visual Acuity Right Eye Distance:   Left Eye Distance:   Bilateral Distance:    Right Eye Near:   Left Eye Near:    Bilateral Near:     Physical Exam Constitutional:      Appearance: Normal appearance.  Neck:     Vascular: No carotid bruit (bilat).  Cardiovascular:     Rate and Rhythm: Normal rate and regular rhythm.     Heart sounds: Normal heart sounds.  Pulmonary:     Effort: Pulmonary effort is normal.     Breath sounds: Normal breath sounds and air entry.  No wheezing, rhonchi or rales.  Lymphadenopathy:     Cervical: No cervical adenopathy.  Neurological:     Mental Status: She is alert and oriented to person, place, and time.     Cranial Nerves: Cranial nerves 2-12 are intact.     Motor: Motor function is intact.     Coordination:  Coordination is intact.      UC Treatments / Results  Labs (all labs ordered are listed, but only abnormal results are displayed) Labs Reviewed - No data to display  EKG   Radiology No results found.  Procedures Procedures (including critical care time)  Medications Ordered in UC Medications - No data to display  Initial Impression / Assessment and Plan / UC Course  I have reviewed the triage vital signs and the nursing notes.  Pertinent labs & imaging results that were available during my care of the patient were reviewed by me and considered in my medical decision making (see chart for details).    Plan: 1.  Vertigo: A.  Zofran 2 mg every 8 hours as needed for dizziness and nausea. B.  Advised to increase fluid intake to avoid dehydration. 2.  Elevated blood pressure: A.  Advised to continue to monitor blood pressure at rest. B.  Advised to follow-up with family practice provider with blood pressure elevations to determine if adjustments in blood pressure medicine are needed. 3.  Patient advised to report to the emergency room if symptoms fail to improve or is followed with headache, vision changes, slurred speech, numbness or tingling of the face arms or legs.  Final Clinical Impressions(s) / UC Diagnoses   Final diagnoses:  Benign paroxysmal positional vertigo, unspecified laterality  Elevated blood pressure reading     Discharge Instructions      Advised to increase fluid intake and make sure that drinking enough fluids throughout the day as this will help protect against dehydration. Advised take 1/2 tablet of the Zofran every 8 hours over the next 1 to 2 days to see if this  helps relieve the nausea, dizziness and off-balance sensation.  Advised to follow-up PCP or return to urgent care if symptoms fail to improve.    ED Prescriptions     Medication Sig Dispense Auth. Provider   ondansetron (ZOFRAN-ODT) 4 MG disintegrating tablet Take 0.5 tablets (2 mg total) by mouth every 8 (eight) hours as needed for nausea or vomiting. 8 tablet Ellsworth Lennox, PA-C      PDMP not reviewed this encounter.   Ellsworth Lennox, PA-C 08/10/22 1237

## 2022-08-10 NOTE — Discharge Instructions (Signed)
Advised to increase fluid intake and make sure that drinking enough fluids throughout the day as this will help protect against dehydration. Advised take 1/2 tablet of the Zofran every 8 hours over the next 1 to 2 days to see if this helps relieve the nausea, dizziness and off-balance sensation.  Advised to follow-up PCP or return to urgent care if symptoms fail to improve.

## 2022-08-10 NOTE — ED Triage Notes (Signed)
Pt c/o high blood pressure readings this morning sx's nasuea, dizziness, fatigue, pt states there is a family hx of stroke.

## 2022-08-16 ENCOUNTER — Ambulatory Visit
Admission: EM | Admit: 2022-08-16 | Discharge: 2022-08-16 | Disposition: A | Discharge status: Home / Self Care | Payer: Medicare Other | Attending: Emergency Medicine | Admitting: Emergency Medicine

## 2022-08-16 DIAGNOSIS — I10 Essential (primary) hypertension: Secondary | ICD-10-CM | POA: Diagnosis not present

## 2022-08-16 NOTE — Discharge Instructions (Addendum)
Blood pressure in office today is 157/78, while this is elevated it is not emergent therefore I would like you to keep your upcoming appointment with your primary doctor on Wednesday  EKG shows that your heart is beating in a normal pace and rhythm and your heart rate is stable  While I do believe your blood pressure is somewhat elevated I believe it is going into the 180s related to anxiety as she has been frequently taking your blood pressure at home  Moving forward I do not want you to take your blood pressure more than twice a day, once in the morning and once in the evening where you are in a calm and relaxed state  At any point if you begin to have chest pain, shortness of breath, dizziness, lightheadedness, visual changes please go to the nearest emergency department for immediate evaluation

## 2022-08-16 NOTE — ED Triage Notes (Addendum)
Pt presents to UC "for same issue" she was here last Tuesday for. Pt states she has been monitoring BP at home and highest has been is 186/93. Pt denies any headache. Pt states she does take amlodipine & losartan for her BP. Pt states she does have a follow up appt with her PCP on Wednesday but decided to come in today because she got concerned seeing the high reading earlier at home.

## 2022-08-16 NOTE — ED Provider Notes (Signed)
[facility]    CSN: [id number] Arrival date & time: 08/16/22  1649      History   Chief Complaint No chief complaint on file.   HPI Anne Crosby is a 82 y.o. female.   Patient presents for evaluation of an elevated blood pressure reading today peaking at 186/93, systolically ranging from 160-180.endorses baseline systolically in the 130s.  History of hypertension taking amlodipine and losartan daily as prescribed, denies missing dosages.  Has upcoming PCP appointment on Wednesday for elevated blood pressure.  Has been evaluated 4 times over the past 7 days for same symptoms, was monitored for at least 24 hours in the emergency department within the week due to elevated blood pressure and tachycardia, deemed stable and advised to follow-up with PCP.  Currently taking amoxicillin for UTI.  Denies dizziness, lightheadedness, weakness, headache, vision changes, chest pain or shortness of breath.    Past Medical History:  Diagnosis Date   Hyperlipemia    Hypertension    Kidney stones     Patient Active Problem List   Diagnosis Date Noted   Urinary retention 08/13/2021   Hydronephrosis, right 08/12/2021   Hypertension 08/12/2021   Hyperlipemia 08/12/2021   Acute urinary retention 08/12/2021   Hyponatremia 08/12/2021   Diabetes mellitus without complication 08/12/2021   GERD (gastroesophageal reflux disease) 08/12/2021   Constipation 08/12/2021   Hypokalemia     Past Surgical History:  Procedure Laterality Date   ABDOMINAL HYSTERECTOMY     CATARACT EXTRACTION     COLON SURGERY     kidney stone removal      OB History   No obstetric history on file.      Home Medications    Prior to Admission medications   Medication Sig Start Date End Date Taking? Authorizing Provider  amLODipine (NORVASC) 10 MG tablet Take 10 mg by mouth daily.    [provider]  aspirin 81 MG tablet Take 81 mg by mouth daily.    [provider]  atorvastatin  (LIPITOR) 20 MG tablet Take 20 mg by mouth daily.    [provider]  bifidobacterium infantis (ALIGN) capsule Take 1 capsule by mouth daily. 05/09/19   [provider]  Calcium Citrate-Vitamin D (CALCIUM CITRATE CHEWY BITE PO) Take 1 tablet by mouth daily.    [provider]  Cholecalciferol (VITAMIN D) 50 MCG (2000 UT) CAPS Take 2,000 Units by mouth daily. 05/19/09   [provider]  estradiol (ESTRACE) 0.1 MG/GM vaginal cream Place vaginally. 10/12/21   [provider]  gabapentin (NEURONTIN) 300 MG capsule Take 300 mg by mouth 3 (three) times daily. 12/12/21   [provider]  losartan (COZAAR) 50 MG tablet Take 50 mg by mouth daily.    [provider]  metFORMIN (GLUCOPHAGE-XR) 500 MG 24 hr tablet TAKE 1 TABLET BY MOUTH EVERY DAY WITH DINNER 03/11/21   [provider]  Multiple Vitamin (MULTIVITAMIN ADULT PO) Take 1 tablet by mouth daily. 08/10/06   [provider]  ondansetron (ZOFRAN-ODT) 4 MG disintegrating tablet Take 0.5 tablets (2 mg total) by mouth every 8 (eight) hours as needed for nausea or vomiting. 08/10/22   Ellsworth Lennox, PA-C  pantoprazole (PROTONIX) 40 MG tablet Take 40 mg by mouth daily. 01/09/21   [provider]  phenazopyridine (PYRIDIUM) 200 MG tablet Take 1 tablet (200 mg total) by mouth 3 (three) times daily. 12/23/21   Becky Augusta, NP  phenazopyridine (PYRIDIUM) 200 MG tablet Take 1 tablet (  200 mg total) by mouth 3 (three) times daily as needed for pain. 03/14/22   Domenick Gong, MD  polyethylene glycol (MIRALAX / GLYCOLAX) 17 g packet Take 17 g by mouth daily as needed. 08/13/21   [name], MD  potassium bicarbonate (K-LYTE) 25 MEQ disintegrating tablet Take 1 tablet by mouth daily. 06/02/21 06/02/22  [provider]    Family History Family History  Problem Relation Age of Onset   Breast cancer Mother 57   Cancer Mother    Alzheimer's disease Mother    Stroke  Mother    Heart disease Father    Stroke Father     Social History Social History   Tobacco Use   Smoking status: Never   Smokeless tobacco: Never  Substance Use Topics   Alcohol use: No   Drug use: No     Allergies   Ciprofloxacin, Diclofenac, Doxycycline, Elemental sulfur, Nitrofurantoin, Phenazopyridine, and Sulfa antibiotics   Review of Systems Review of Systems   Physical Exam Triage Vital Signs ED Triage Vitals  Enc Vitals Group     BP 08/16/22 1728 (!) 157/78     Pulse Rate 08/16/22 1728 87     Resp --      Temp 08/16/22 1728 98.3 F (36.8 C)     Temp Source 08/16/22 1728 Oral     SpO2 08/16/22 1728 97 %     Weight --      Height --      Head Circumference --      Peak Flow --      Pain Score 08/16/22 1727 0     Pain Loc --      Pain Edu? --      Excl. in GC? --    No data found.  Updated Vital Signs BP (!) 157/78 (BP Location: Left Arm)   Pulse 87   Temp 98.3 F (36.8 C) (Oral)   SpO2 97%   Visual Acuity Right Eye Distance:   Left Eye Distance:   Bilateral Distance:    Right Eye Near:   Left Eye Near:    Bilateral Near:     Physical Exam Constitutional:      Appearance: Normal appearance.  Eyes:     Extraocular Movements: Extraocular movements intact.  Cardiovascular:     Rate and Rhythm: Normal rate and regular rhythm.     Pulses: Normal pulses.     Heart sounds: Normal heart sounds.  Pulmonary:     Effort: Pulmonary effort is normal.     Breath sounds: Normal breath sounds.  Neurological:     General: No focal deficit present.     Mental Status: She is alert and oriented to person, place, and time. Mental status is at baseline.      UC Treatments / Results  Labs (all labs ordered are listed, but only abnormal results are displayed) Labs Reviewed - No data to display  EKG   Radiology No results found.  Procedures Procedures (including critical care time)  Medications Ordered in UC Medications - No data to  display  Initial Impression / Assessment and Plan / UC Course  I have reviewed the triage vital signs and the nursing notes.  Pertinent labs & imaging results that were available during my care of the patient were reviewed by me and considered in my medical decision making (see chart for details).  Elevated blood pressure reading in office with a diagnosis of hypertension  Initial blood pressure in triage  157/78, on recheck manually 146/82, no signs of hypertensive urgency, EKG shows normal sinus rhythm, stable for outpatient management, has upcoming PCP appointment in 2 days for blood pressure management and therefore will not make changes to medication today, discussed this with patient, per documentation patient has check blood pressure at least 10 times today and I believe higher readings are related to anxiety and therefore advised patient to check blood pressure no more than twice a day, once in the morning in the evening and continue to take medications as directed, given strict precautions that if she begins to become symptomatic then she may return for reevaluation  Final Clinical Impressions(s) / UC Diagnoses   Final diagnoses:  None   Discharge Instructions   None    ED Prescriptions   None    PDMP not reviewed this encounter.   Valinda Hoar, [place] 08/17/22 [phone]

## 2023-04-13 ENCOUNTER — Other Ambulatory Visit: Payer: Self-pay | Admitting: Unknown Physician Specialty

## 2023-04-13 DIAGNOSIS — M810 Age-related osteoporosis without current pathological fracture: Secondary | ICD-10-CM

## 2023-05-16 ENCOUNTER — Ambulatory Visit: Payer: Medicare Other | Attending: Unknown Physician Specialty

## 2023-06-13 ENCOUNTER — Ambulatory Visit
Admission: RE | Admit: 2023-06-13 | Discharge: 2023-06-13 | Disposition: A | Discharge status: Home / Self Care | Payer: Medicare Other | Source: Ambulatory Visit | Attending: Unknown Physician Specialty | Admitting: Unknown Physician Specialty

## 2023-06-13 DIAGNOSIS — M810 Age-related osteoporosis without current pathological fracture: Secondary | ICD-10-CM | POA: Diagnosis present

## 2023-08-28 IMAGING — DX DG ABDOMEN 1V
1 series · 1 of 1 positions shown · non-contrast
Comparison: October 29, 2019, CT of the abdomen and pelvis from Sunday July, 2021.

CLINICAL DATA: Hydronephrosis, constipation.

EXAM:
ABDOMEN - 1 VIEW

[abdomen supine]
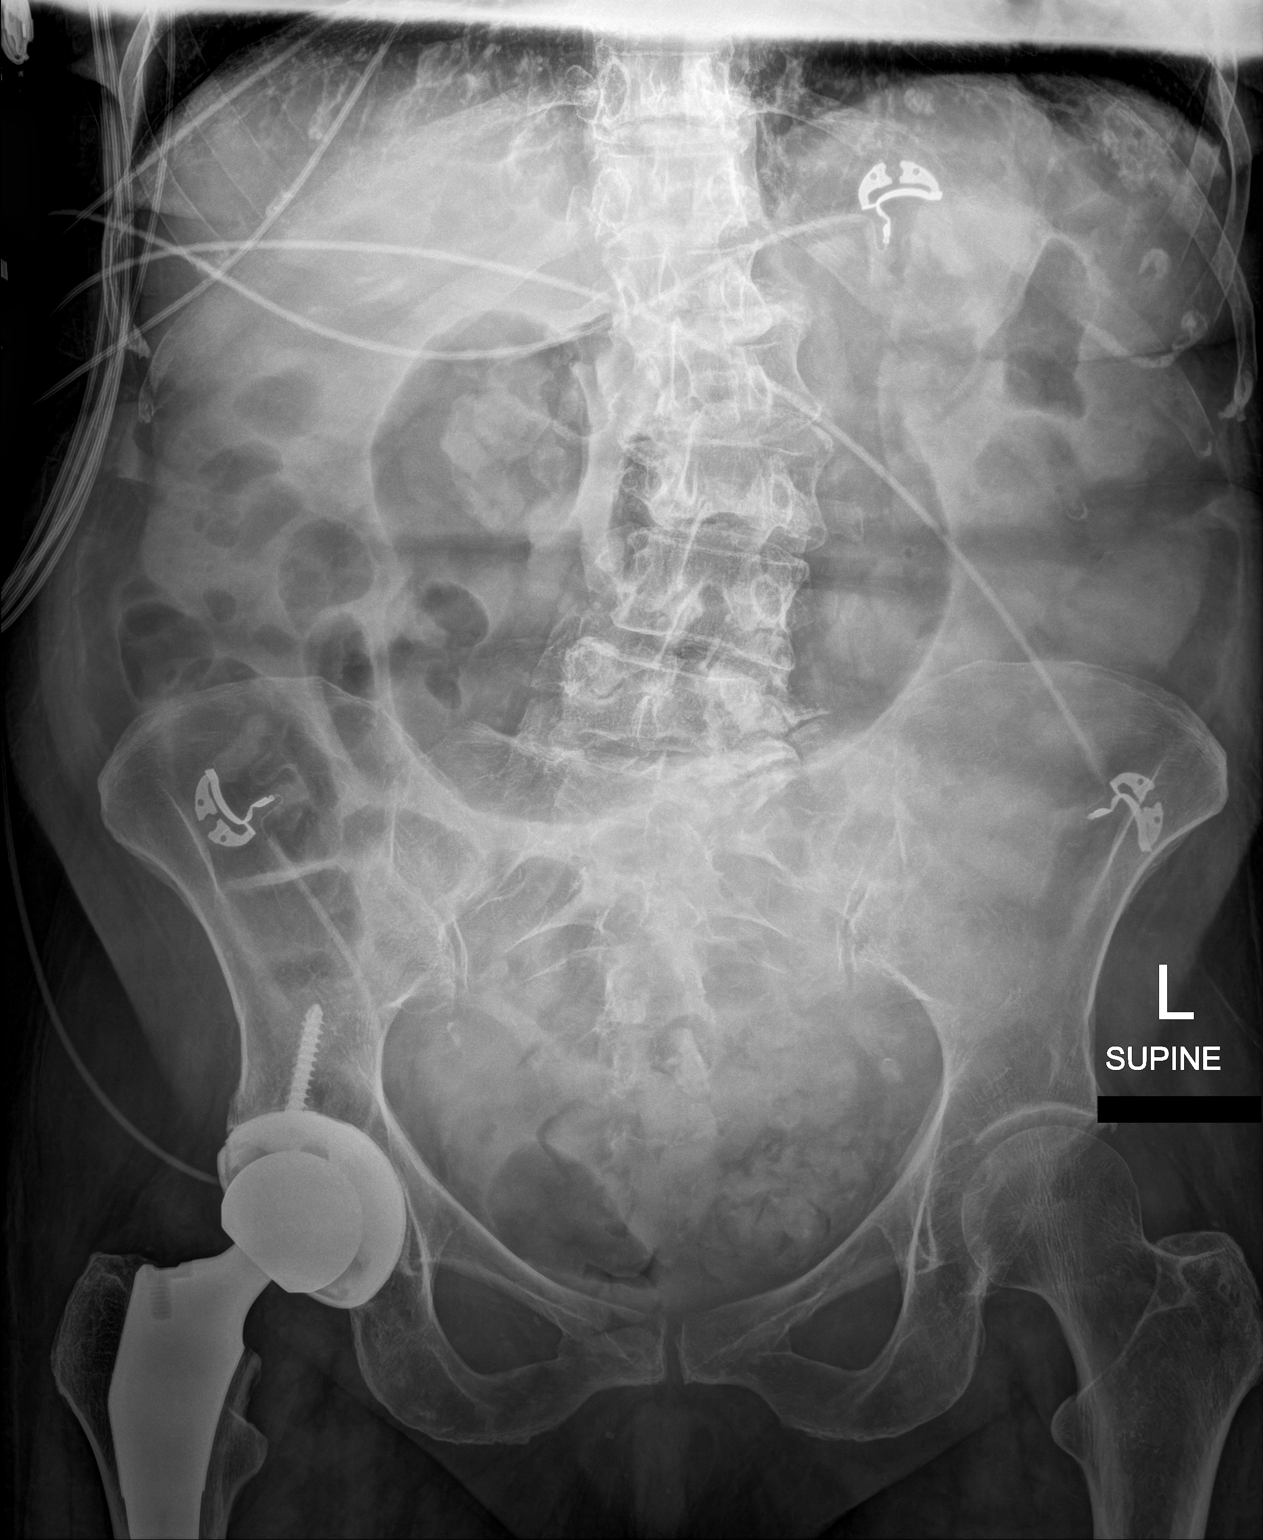

[1 of 1 positions shown; findings below may reference images not displayed]

FINDINGS: Increasing gastric distension since the recent CT evaluation.

Gas throughout the colon without substantial distension. No signs of
small bowel obstruction. Small amount of stool and gas in the
rectum.

On limited assessment no acute skeletal findings with substantial
degenerative change in the lumbar spine in levoconvex curvature of
the lumbar spine. Post RIGHT hip arthroplasty. EKG leads project
over the abdomen.
IMPRESSION: 1. No sign of bowel obstruction with increasing gastric distension
since previous imaging of uncertain significance.
